# Patient Record
Sex: Male | Born: 1980
Health system: Southern US, Community
[De-identification: ages and names within clinical notes are randomized; demographics above are authoritative.]

## PROBLEM LIST (undated history)

## (undated) DIAGNOSIS — F900 Attention-deficit hyperactivity disorder, predominantly inattentive type: Secondary | ICD-10-CM

## (undated) HISTORY — DX: Attention-deficit hyperactivity disorder, predominantly inattentive type: F90.0

## (undated) HISTORY — PX: NASAL SEPTUM SURGERY: SHX37

## (undated) HISTORY — PX: EYE MUSCLE SURGERY: SHX370

## (undated) HISTORY — PX: WISDOM TOOTH EXTRACTION: SHX21

---

## 2000-02-02 ENCOUNTER — Emergency Department (HOSPITAL_COMMUNITY): Admission: EM | Admit: 2000-02-02 | Discharge: 2000-02-02 | Payer: Self-pay | Admitting: Emergency Medicine

## 2000-02-02 ENCOUNTER — Encounter: Payer: Self-pay | Admitting: Emergency Medicine

## 2008-02-04 ENCOUNTER — Encounter (INDEPENDENT_AMBULATORY_CARE_PROVIDER_SITE_OTHER): Payer: Self-pay | Admitting: *Deleted

## 2008-02-10 ENCOUNTER — Ambulatory Visit: Payer: Self-pay | Admitting: Family Medicine

## 2008-02-10 DIAGNOSIS — R030 Elevated blood-pressure reading, without diagnosis of hypertension: Secondary | ICD-10-CM | POA: Insufficient documentation

## 2008-02-12 ENCOUNTER — Telehealth (INDEPENDENT_AMBULATORY_CARE_PROVIDER_SITE_OTHER): Payer: Self-pay | Admitting: *Deleted

## 2008-02-12 LAB — CONVERTED CEMR LAB
ALT: 98 units/L — ABNORMAL HIGH (ref 0–53)
AST: 47 units/L — ABNORMAL HIGH (ref 0–37)
Albumin: 4.3 g/dL (ref 3.5–5.2)
Alkaline Phosphatase: 63 units/L (ref 39–117)
BUN: 11 mg/dL (ref 6–23)
Basophils Absolute: 0 10*3/uL (ref 0.0–0.1)
Basophils Relative: 0.4 % (ref 0.0–3.0)
Bilirubin, Direct: 0.2 mg/dL (ref 0.0–0.3)
CO2: 29 meq/L (ref 19–32)
Calcium: 9.7 mg/dL (ref 8.4–10.5)
Chloride: 102 meq/L (ref 96–112)
Cholesterol: 263 mg/dL (ref 0–200)
Creatinine, Ser: 1.2 mg/dL (ref 0.4–1.5)
Direct LDL: 171.2 mg/dL
Eosinophils Absolute: 0.1 10*3/uL (ref 0.0–0.7)
Eosinophils Relative: 3.1 % (ref 0.0–5.0)
GFR calc Af Amer: 93 mL/min
GFR calc non Af Amer: 77 mL/min
Glucose, Bld: 93 mg/dL (ref 70–99)
HCT: 42.2 % (ref 39.0–52.0)
HDL: 34.6 mg/dL — ABNORMAL LOW (ref >39.0)
Hemoglobin: 14.7 g/dL (ref 13.0–17.0)
Lymphocytes Relative: 37.2 % (ref 12.0–46.0)
MCHC: 34.9 g/dL (ref 30.0–36.0)
MCV: 91.4 fL (ref 78.0–100.0)
Monocytes Absolute: 0.3 10*3/uL (ref 0.1–1.0)
Monocytes Relative: 7.3 % (ref 3.0–12.0)
Neutro Abs: 2.5 10*3/uL (ref 1.4–7.7)
Neutrophils Relative %: 52 % (ref 43.0–77.0)
Platelets: 272 10*3/uL (ref 150–400)
Potassium: 4.5 meq/L (ref 3.5–5.1)
RBC: 4.61 M/uL (ref 4.22–5.81)
RDW: 12.5 % (ref 11.5–14.6)
Sodium: 139 meq/L (ref 135–145)
TSH: 1.95 microintl units/mL (ref 0.35–5.50)
Total Bilirubin: 1.4 mg/dL — ABNORMAL HIGH (ref 0.3–1.2)
Total CHOL/HDL Ratio: 7.6
Total Protein: 7.7 g/dL (ref 6.0–8.3)
Triglycerides: 232 mg/dL (ref 0–149)
VLDL: 46 mg/dL — ABNORMAL HIGH (ref 0–40)
WBC: 4.6 10*3/uL (ref 4.5–10.5)

## 2010-04-05 ENCOUNTER — Encounter: Payer: Self-pay | Admitting: Family Medicine

## 2010-04-05 ENCOUNTER — Ambulatory Visit
Admission: RE | Admit: 2010-04-05 | Discharge: 2010-04-05 | Payer: Self-pay | Source: Home / Self Care | Attending: Family Medicine | Admitting: Family Medicine

## 2010-04-05 DIAGNOSIS — L089 Local infection of the skin and subcutaneous tissue, unspecified: Secondary | ICD-10-CM | POA: Insufficient documentation

## 2010-04-05 DIAGNOSIS — L0291 Cutaneous abscess, unspecified: Secondary | ICD-10-CM | POA: Insufficient documentation

## 2010-04-05 DIAGNOSIS — L0390 Cellulitis, unspecified: Secondary | ICD-10-CM

## 2010-05-11 NOTE — Assessment & Plan Note (Signed)
Summary: cyst on back infected///sph   Vital Signs:  Patient profile:   30 year old male Weight:      256 pounds BMI:     37.94 Temp:     98.1 degrees F oral BP sitting:   120 / 80  (left arm)  Vitals Entered By: Doristine Devoid CMA (April 05, 2010 2:49 PM) CC: cyst on Lower back infected some redness and painful   History of Present Illness: 30 yo man here today for infected cyst on lower back.  first appeared  ~3 weeks ago.  went to UC 2.5 weeks ago and was started on Bactrim but is not taking it as directed as he still has pills left.  hx of similar previously but never required medical attention.  area is red and sore.  2 days ago squeezed the area and 'got a lot of stuff out of it'.  area is now much less painful.  Current Medications (verified): 1)  None  Allergies (verified): No Known Drug Allergies  Review of Systems      See HPI  Physical Exam  General:  Overweight, well-developed,well-nourished,in no acute distress; alert,appropriate and cooperative throughout examination Skin:  large area in center of lower back that is erythematous w/ central pore.  some induration.  thin, bloody drainage when pressed.   Impression & Recommendations:  Problem # 1:  INFECTION, SKIN AND SOFT TISSUE (ICD-686.9) Assessment New no need for I&D as area is draining spontaneously.  encouraged him to finish abx as directed.  reviewed supportive care and red flags that should prompt return.  Pt expresses understanding and is in agreement w/ this plan.  Other Orders: Specimen Handling (19147) T-Culture, Wound ([id number])  Patient Instructions: 1)  Finish the Bactrim as directed- two times a day 2)  Keep the area clean and dry 3)  Call if it again worsens- meaning the redness is again increasing or the pus is back 4)  Schedule your complete physical at your convenience 5)  Hang in there! 6)  Happy New Year!   Orders Added: 1)  Specimen Handling [99000] 2)  T-Culture,  Wound [[id number]] 3)  Est. Patient Level III [82956]

## 2012-06-06 ENCOUNTER — Other Ambulatory Visit: Payer: Self-pay | Admitting: Family Medicine

## 2012-06-06 MED ORDER — OSELTAMIVIR PHOSPHATE 75 MG PO CAPS: 75.0000 mg | ORAL_CAPSULE | Freq: Every day | ORAL | Status: DC

## 2012-10-31 ENCOUNTER — Encounter: Payer: Self-pay | Admitting: Physician Assistant

## 2012-10-31 ENCOUNTER — Ambulatory Visit (INDEPENDENT_AMBULATORY_CARE_PROVIDER_SITE_OTHER): Payer: Federal, State, Local not specified - PPO | Admitting: Physician Assistant

## 2012-10-31 VITALS — BP 124/82 | HR 97 | Temp 98.1°F | Resp 16 | Ht 69.0 in | Wt 246.2 lb

## 2012-10-31 DIAGNOSIS — K111 Hypertrophy of salivary gland: Secondary | ICD-10-CM

## 2012-10-31 DIAGNOSIS — R600 Localized edema: Secondary | ICD-10-CM

## 2012-10-31 DIAGNOSIS — Z0000 Encounter for general adult medical examination without abnormal findings: Secondary | ICD-10-CM

## 2012-10-31 NOTE — Patient Instructions (Signed)
Please come back next Monday morning to get fasting labs.  For swelling of saliva glands, massage under chin to help express saliva.  Also try sucking on sour candy to help with saliva production.  If pain worsens or persists past a week, please return to office.      Salivary Gland Infection A salivary gland infection can be caused by a virus, bacteria from the mouth, or a stone. Mumps and other viruses may settle in one or more of the saliva glands. This will result in swelling, pain, and difficulty eating. Bacteria may cause a more severe infection in a salivary gland. A salivary stone blocking the flow of saliva can make this worse. These infections may be related to other medical problems. Some of these are dehydration, recent surgery, poor nutrition, and some medications. TREATMENT  Treatment of a salivary gland infection depends on the cause. Mumps and other virus infections do not require antibiotics. If bacteria cause the infection, then antibiotics are needed to get rid of the infection. If there is a salivary stone blocking the duct, minor surgery to remove the stone may be needed.  HOME CARE INSTRUCTIONS   Get plenty of rest, increase your fluids, and use warm compresses on the swollen area for 15 to 20 minutes 4 times per day or as often as feels good to you.  Suck on hard candy or chew sugarless gum to promote saliva production.  Only take over-the-counter or prescription medicines for pain, discomfort, or fever as directed by your caregiver. SEEK IMMEDIATE MEDICAL CARE IF:   You have increased swelling or pain or pain not relieved with medications.  You develop chills or a fever.  Any of your problems are getting worse rather than better. Document Released: 05/03/2004 Document Revised: 06/18/2011 Document Reviewed: 03/26/2005 [facility] Patient Information 2014 [place], [place].

## 2012-10-31 NOTE — Assessment & Plan Note (Signed)
Patient to return Monday am to get fasting labs -- CBC, BMP, TSH, Lipid Profile

## 2012-10-31 NOTE — Assessment & Plan Note (Signed)
Does not look inflamed or infected at present time.  Patient advised how to milk the salivary duct to help reduce the swelling.  Also instructed to try sucking on sour candy to help relieve the swelling.  Swelling should subside in a couple of days.  If swelling worsens or pain becomes more severe, patient to return to clinic.

## 2012-10-31 NOTE — Progress Notes (Signed)
Patient ID: Douglas Winters, male   DOB: June 03, 1980, 32 y.o.   MRN: [id number]  Patient is a 32 year-old, caucasian male who presents to clinic today to establish care.   Acute Complaints: Patient complains of knot under the right side of his tongue for the past day.  States he has recently had a "sinus problem" within the past week that has cleared on its own.  States the pain first occurred last night after dinner and persisted for an hour or so.  He took some OTC pain medication with moderate relief of symptoms.  Since then he has not has any more pain but this am he noticed a knot under his tongue that has persisted.  Denies fever, headache, difficullty swallowing, sore throat.  Denies prior history of this type of issue.  Otherwise feels fine.    Past Medical History  Diagnosis Date  . Chicken pox   . Cellulitis of lower back     No current outpatient prescriptions on file prior to visit.   No current facility-administered medications on file prior to visit.   No Known Allergies  Family History  Problem Relation Age of Onset  . Healthy Mother   . Asthma Father     Childhood  . Diabetes Maternal Grandmother   . Clotting disorder Paternal Grandfather     Deceased  . Alcoholism Paternal Grandfather   . Healthy Brother   . Amblyopia Child     x2    History   Social History  . Marital Status: Married    Spouse Name: N/A    Number of Children: N/A  . Years of Education: N/A   Social History Main Topics  . Smoking status: Never Smoker   . Smokeless tobacco: Never Used  . Alcohol Use: 0.0 oz/week    0-1 Cans of beer per week  . Drug Use: No  . Sexually Active: Yes    Birth Control/ Protection: Condom   Other Topics Concern  . None   Social History Narrative  . None   Review of Systems  Constitutional: Negative for fever, chills, weight loss and malaise/fatigue.  HENT: Negative for hearing loss and neck pain.   Eyes: Negative for blurred vision, double vision and  photophobia.  Respiratory: Negative for cough, hemoptysis, sputum production, shortness of breath and wheezing.   Cardiovascular: Negative for chest pain, palpitations and leg swelling.  Gastrointestinal: Negative for heartburn, nausea, vomiting, abdominal pain, diarrhea, constipation, blood in stool and melena.  Genitourinary: Negative for dysuria, urgency, frequency and hematuria.  Musculoskeletal: Negative for myalgias, back pain and joint pain.  Neurological: Negative for dizziness, loss of consciousness and headaches.  Endo/Heme/Allergies: Negative for polydipsia. Does not bruise/bleed easily.  Psychiatric/Behavioral: Negative for depression, suicidal ideas and substance abuse.        Physical Exam  Constitutional: He is oriented to person, place, and time. He appears well-developed and well-nourished. No distress.  HENT:  Head: Normocephalic and atraumatic.  Right Ear: External ear normal.  Left Ear: External ear normal.  Nose: Nose normal.  Mouth/Throat: Oropharynx is clear and moist. No oropharyngeal exudate.  Area of swelling underneath mucosa adjacent to R Wharton duct.  Eyes: Conjunctivae and EOM are normal. Pupils are equal, round, and reactive to light.  Neck: Normal range of motion. Neck supple. No thyromegaly present.  Cardiovascular: Normal rate, regular rhythm, normal heart sounds and intact distal pulses.   Pulmonary/Chest: Effort normal and breath sounds normal. No respiratory distress. He has no wheezes.  He has no rales.  Abdominal: Soft. Bowel sounds are normal. He exhibits no distension. There is no tenderness. There is no rebound and no guarding.  Lymphadenopathy:    He has no cervical adenopathy.  Neurological: He is alert and oriented to person, place, and time. He has normal reflexes.  Skin: Skin is warm and dry. He is not diaphoretic.    Assessment/Plan: (1) R Submandibular Salivary Gland Swelling  -- Does not look inflamed or infected at present time.   Patient advised how to milk the salivary duct to help reduce the swelling.  Also instructed to try sucking on sour candy to help relieve the swelling.  Swelling should subside in a couple of days.  If swelling worsens or pain becomes more severe, patient to return to clinic.  (2) Preventive Health Visit -- Patient to return Monday am to get fasting labs -- CBC, BMP, TSH, Lipid Profile  Otherwise, patient to return to clinic as needed and yearly for CPE.

## 2012-11-03 LAB — CBC WITH DIFFERENTIAL/PLATELET
Basophils Absolute: 0 10*3/uL (ref 0.0–0.1)
Basophils Relative: 1 % (ref 0–1)
Eosinophils Absolute: 0.1 10*3/uL (ref 0.0–0.7)
Eosinophils Relative: 4 % (ref 0–5)
HCT: 39.5 % (ref 39.0–52.0)
Hemoglobin: 14.2 g/dL (ref 13.0–17.0)
Lymphocytes Relative: 44 % (ref 12–46)
Lymphs Abs: 1.6 10*3/uL (ref 0.7–4.0)
MCH: 31.1 pg (ref 26.0–34.0)
MCHC: 35.9 g/dL (ref 30.0–36.0)
MCV: 86.4 fL (ref 78.0–100.0)
Monocytes Absolute: 0.4 10*3/uL (ref 0.1–1.0)
Monocytes Relative: 11 % (ref 3–12)
Neutro Abs: 1.5 10*3/uL — ABNORMAL LOW (ref 1.7–7.7)
Neutrophils Relative %: 40 % — ABNORMAL LOW (ref 43–77)
Platelets: 278 10*3/uL (ref 150–400)
RBC: 4.57 MIL/uL (ref 4.22–5.81)
RDW: 13 % (ref 11.5–15.5)
WBC: 3.7 10*3/uL — ABNORMAL LOW (ref 4.0–10.5)

## 2012-11-03 LAB — LIPID PANEL
Cholesterol: 172 mg/dL (ref 0–200)
LDL Cholesterol: 108 mg/dL — ABNORMAL HIGH (ref 0–99)
VLDL: 35 mg/dL (ref 0–40)

## 2012-11-03 LAB — BASIC METABOLIC PANEL
BUN: 17 mg/dL (ref 6–23)
CO2: 27 mEq/L (ref 19–32)
Calcium: 9.7 mg/dL (ref 8.4–10.5)
Chloride: 103 mEq/L (ref 96–112)
Creat: 1.16 mg/dL (ref 0.50–1.35)
Glucose, Bld: 98 mg/dL (ref 70–99)
Potassium: 4.2 mEq/L (ref 3.5–5.3)
Sodium: 139 mEq/L (ref 135–145)

## 2012-11-03 NOTE — Addendum Note (Signed)
Addended by: Regis Bill on: 11/03/2012 09:57 AM   Modules accepted: Orders

## 2012-12-22 ENCOUNTER — Ambulatory Visit: Payer: Self-pay | Admitting: Family Medicine

## 2013-02-24 ENCOUNTER — Encounter: Payer: Self-pay | Admitting: Family

## 2013-02-24 ENCOUNTER — Ambulatory Visit (INDEPENDENT_AMBULATORY_CARE_PROVIDER_SITE_OTHER): Payer: Federal, State, Local not specified - PPO | Admitting: Family

## 2013-02-24 VITALS — BP 108/74 | HR 95 | Temp 98.2°F | Resp 18 | Ht 69.0 in | Wt 241.5 lb

## 2013-02-24 DIAGNOSIS — J329 Chronic sinusitis, unspecified: Secondary | ICD-10-CM

## 2013-02-24 MED ORDER — KETOROLAC TROMETHAMINE 30 MG/ML IJ SOLN
60.0000 mg | Freq: Once | INTRAMUSCULAR | Status: AC
  Administered 2013-02-24: 60 mg via INTRAMUSCULAR

## 2013-02-24 MED ORDER — AMOXICILLIN 500 MG PO CAPS: 500.0000 mg | ORAL_CAPSULE | Freq: Three times a day (TID) | ORAL | Status: DC

## 2013-02-24 MED ORDER — KETOROLAC TROMETHAMINE 60 MG/2ML IJ SOLN: 60.0000 mg | Freq: Once | INTRAMUSCULAR | Status: DC

## 2013-02-24 NOTE — Progress Notes (Signed)
Pre visit review using our clinic review tool, if applicable. No additional management support is needed unless otherwise documented below in the visit note/SLS  

## 2013-02-24 NOTE — Progress Notes (Signed)
  Subjective:    Patient ID: Douglas Winters, male    DOB: 1980-04-22, 32 y.o.   MRN: [id number]  HPI  Douglas Winters is a 32 yr old male who presents today with chief complaint of headache. Reports HA started Sunday evening.  Pain is worse with "pushing on eyeballs."  Reports mild post nasal drip. Reports temp last week 101.  Has tried excedrin and advil yesterday without improvement in symptoms. Denies associated sore throat, cough or severe neck pain.  Notes mild posterior neck soreness.  Denies hx of migraines.    Review of Systems See HPI  Past Medical History  Diagnosis Date  . Chicken pox   . Cellulitis of lower back     History   Social History  . Marital Status: Married    Spouse Name: N/A    Number of Children: N/A  . Years of Education: N/A   Occupational History  . Not on file.   Social History Main Topics  . Smoking status: Never Smoker   . Smokeless tobacco: Never Used  . Alcohol Use: 0.0 oz/week    0-1 Cans of beer per week  . Drug Use: No  . Sexual Activity: Yes    Birth Control/ Protection: Condom   Other Topics Concern  . Not on file   Social History Narrative  . No narrative on file    Past Surgical History  Procedure Laterality Date  . Wisdom tooth extraction    . Eye muscle surgery    . Nasal septum surgery      Family History  Problem Relation Age of Onset  . Healthy Mother   . Asthma Father     Childhood  . Diabetes Maternal Grandmother   . Clotting disorder Paternal Grandfather     Deceased  . Alcoholism Paternal Grandfather   . Healthy Brother   . Amblyopia Child     x2    No Known Allergies  Current Outpatient Prescriptions on File Prior to Visit  Medication Sig Dispense Refill  . Aspirin-Acetaminophen-Caffeine (EXCEDRIN EXTRA STRENGTH PO) Take by mouth as needed.       No current facility-administered medications on file prior to visit.    BP 108/74  Pulse 95  Temp(Src) 98.2 F (36.8 C) (Oral)  Resp 18  Ht 5\' 9"  (1.753  m)  Wt 241 lb 8 oz (109.544 kg)  BMI 35.65 kg/m2  SpO2 98%       Objective:   Physical Exam  Constitutional: He is oriented to person, place, and time. He appears well-developed and well-nourished.  HENT:  Head: Normocephalic and atraumatic.  L TM is mildy pink in color without bulging.  Tonsils are 2+  Cardiovascular: Normal rate and regular rhythm.   No murmur heard. Pulmonary/Chest: Effort normal and breath sounds normal. No respiratory distress. He has no wheezes. He has no rales. He exhibits no tenderness.  Musculoskeletal: He exhibits no edema.  Neurological: He is alert and oriented to person, place, and time.  Psychiatric: He has a normal mood and affect. His behavior is normal. Judgment and thought content normal.          Assessment & Plan:

## 2013-02-24 NOTE — Assessment & Plan Note (Addendum)
Rx with amoxicillin. Toradol 60mg  IM x 1 for HA.  Suspect early L OM as well. Follow up if symptoms worsen or if symptoms do not impove.

## 2013-02-24 NOTE — Patient Instructions (Signed)
Please call if symptoms worsen, or if not improved in 2-3 days.  

## 2013-06-17 ENCOUNTER — Telehealth: Payer: Self-pay | Admitting: Physician Assistant

## 2013-06-17 NOTE — Telephone Encounter (Signed)
Pt son was diagnosed with pink eye, now he has it, can something be called in or does he need to be seen. [facility] [address]

## 2013-06-17 NOTE — Telephone Encounter (Signed)
Called Pt and let him know he would need to be seen, very upset that he will need to come in, when he already knows what it is. States its just for us to get money. Will call another dr.

## 2013-06-17 NOTE — Telephone Encounter (Signed)
I typically will not call in an antibiotic without seeing the patient.  I would need him to come in for a visit. Sorry for the inconvenience.

## 2013-09-01 ENCOUNTER — Encounter: Payer: Self-pay | Admitting: Family

## 2013-09-01 ENCOUNTER — Ambulatory Visit (INDEPENDENT_AMBULATORY_CARE_PROVIDER_SITE_OTHER): Payer: Federal, State, Local not specified - PPO | Admitting: Family

## 2013-09-01 VITALS — BP 122/86 | HR 88 | Temp 97.9°F | Resp 18 | Ht 69.0 in | Wt 249.1 lb

## 2013-09-01 DIAGNOSIS — L02219 Cutaneous abscess of trunk, unspecified: Secondary | ICD-10-CM

## 2013-09-01 DIAGNOSIS — L03319 Cellulitis of trunk, unspecified: Principal | ICD-10-CM

## 2013-09-01 MED ORDER — DOXYCYCLINE HYCLATE 100 MG PO TABS: 100.0000 mg | ORAL_TABLET | Freq: Two times a day (BID) | ORAL | Status: DC

## 2013-09-01 NOTE — Progress Notes (Signed)
Pre visit review using our clinic review tool, if applicable. No additional management support is needed unless otherwise documented below in the visit note. 

## 2013-09-01 NOTE — Assessment & Plan Note (Signed)
No obvious fluctuance to drain today. Will start doxycycline. Advised pt to take as directed, monitor site for increased erythema, drainage.  Call if symptoms worsen, otherwise, follow up on Friday for re-evaluation.

## 2013-09-01 NOTE — Patient Instructions (Signed)
Please follow up on Friday

## 2013-09-01 NOTE — Progress Notes (Signed)
   Subjective:    Patient ID: Douglas Winters, male    DOB: [birth year], 33 y.o.   MRN: [id number]  HPI  Douglas Winters is a 33 yr old male who presents today to discuss a cyst on his back. Douglas Winters reports that symptoms started 5-6 days ago.  Has previous hx of cyst in the same area 3-4 years ago. Believes MD drained the cyst and gave him abx. Douglas Winters is scheduled to go to the beach on 6/8 and is concerned that this will interfere with his trip.  Area is painful. Denies associated drainage.   Review of Systems    see HPI  Past Medical History  Diagnosis Date  . Chicken pox   . Cellulitis of lower back     History   Social History  . Marital Status: Married    Spouse Name: N/A    Number of Children: N/A  . Years of Education: N/A   Occupational History  . Not on file.   Social History Main Topics  . Smoking status: Never Smoker   . Smokeless tobacco: Never Used  . Alcohol Use: 0.0 oz/week    0-1 Cans of beer per week  . Drug Use: No  . Sexual Activity: Yes    Birth Control/ Protection: Condom   Other Topics Concern  . Not on file   Social History Narrative  . No narrative on file    Past Surgical History  Procedure Laterality Date  . Wisdom tooth extraction    . Eye muscle surgery    . Nasal septum surgery      Family History  Problem Relation Age of Onset  . Healthy Mother   . Asthma Father     Childhood  . Diabetes Maternal Grandmother   . Clotting disorder Paternal Grandfather     Deceased  . Alcoholism Paternal Grandfather   . Healthy Brother   . Amblyopia Child     x2    No Known Allergies  No current outpatient prescriptions on file prior to visit.   No current facility-administered medications on file prior to visit.    BP 122/86  Pulse 88  Temp(Src) 97.9 F (36.6 C) (Oral)  Resp 18  Ht 5\' 9"  (1.753 m)  Wt 249 lb 1.9 oz (113 kg)  BMI 36.77 kg/m2  SpO2 99%    Objective:   Physical Exam  Constitutional: Douglas Winters is oriented to person, place, and  time. Douglas Winters appears well-developed and well-nourished. No distress.  Neurological: Douglas Winters is alert and oriented to person, place, and time.  Skin:     3 cm wide round area of tender induration. No obvious fluctuance  Psychiatric: Douglas Winters has a normal mood and affect. His behavior is normal. Judgment and thought content normal.          Assessment & Plan:

## 2013-09-04 ENCOUNTER — Ambulatory Visit: Payer: Federal, State, Local not specified - PPO | Admitting: Family

## 2013-09-04 ENCOUNTER — Ambulatory Visit (INDEPENDENT_AMBULATORY_CARE_PROVIDER_SITE_OTHER): Payer: Federal, State, Local not specified - PPO | Admitting: Family

## 2013-09-04 ENCOUNTER — Encounter: Payer: Self-pay | Admitting: Family

## 2013-09-04 VITALS — BP 124/82 | HR 100 | Temp 98.2°F | Resp 16 | Ht 69.0 in | Wt 246.1 lb

## 2013-09-04 DIAGNOSIS — L03319 Cellulitis of trunk, unspecified: Principal | ICD-10-CM

## 2013-09-04 DIAGNOSIS — L02219 Cutaneous abscess of trunk, unspecified: Secondary | ICD-10-CM

## 2013-09-04 NOTE — Patient Instructions (Signed)
Keep area dry for 24 hours.  Continue doxycycline. Follow up on Tuesday of next week. Call if you develop fever, swelling, worsening pain.

## 2013-09-04 NOTE — Progress Notes (Signed)
Pre visit review using our clinic review tool, if applicable. No additional management support is needed unless otherwise documented below in the visit note. 

## 2013-09-04 NOTE — Progress Notes (Signed)
   Subjective:    Patient ID: PRAGYAN CRITELLI, male    DOB: 1980-05-05, 33 y.o.   MRN: [id number]  HPI  Mr. Pasierb is a 33 yr old male who presents today for follow up of the abscess on his lower back. He was started on doxycycline on 09/01/13.  He reports no improvement in the abscess.     Review of Systems     Objective:   Physical Exam  Constitutional: He appears well-developed and well-nourished. No distress.  Skin:  Abscess diameter 4cm, some fluctuance noted.  Psychiatric: He has a normal mood and affect. His behavior is normal. Judgment and thought content normal.          Assessment & Plan:

## 2013-09-04 NOTE — Assessment & Plan Note (Signed)
Slightly worsened and now fluctuant.   Procedure including risks/benefits explained to patient.  Questions were answered. After informed consent was obtained and a time out completed, site was cleansed with betadine and then alcohol. 1% Lidocaine with epinephrine was infiltrated into abscess and incision and drainage was performed. Copious pruritic drainage was expressed from the abscess.  Abscess appeared loculated with multiple pockets which were broken up gently with sterile q tip. Pt tolerated procedure well.  Pt instructed to keep the area dry for 24 hours and to contact us if he develops redness, drainage or swelling at the site.  Continue doxycycline. Pt may use tylenol as needed for discomfort today.

## 2013-09-07 ENCOUNTER — Ambulatory Visit (INDEPENDENT_AMBULATORY_CARE_PROVIDER_SITE_OTHER): Payer: Federal, State, Local not specified - PPO | Admitting: Family

## 2013-09-07 ENCOUNTER — Ambulatory Visit: Payer: Federal, State, Local not specified - PPO | Admitting: Family

## 2013-09-07 VITALS — BP 114/88 | HR 75 | Temp 98.1°F | Resp 16 | Ht 69.0 in | Wt 249.0 lb

## 2013-09-07 DIAGNOSIS — L02219 Cutaneous abscess of trunk, unspecified: Secondary | ICD-10-CM

## 2013-09-07 DIAGNOSIS — L03319 Cellulitis of trunk, unspecified: Principal | ICD-10-CM

## 2013-09-07 LAB — WOUND CULTURE
GRAM STAIN: NONE SEEN
ORGANISM ID, BACTERIA: NO GROWTH

## 2013-09-07 NOTE — Assessment & Plan Note (Signed)
Resolving, complete abx, follow up if symptoms do not continue to improve.

## 2013-09-07 NOTE — Progress Notes (Signed)
   Subjective:    Patient ID: Douglas Winters, male    DOB: 1980-07-20, 33 y.o.   MRN: [id number]  HPI  Douglas Winters is a 33 yr old male who presents today for follow up of the abscess on his lower back.  He continues doxycycline and notes that he had some purulent drainage over the weekend in the shower which has now turned clear.  Notes resolution of pain.     Review of Systems    see HPI  Past Medical History  Diagnosis Date  . Chicken pox   . Cellulitis of lower back     History   Social History  . Marital Status: Married    Spouse Name: N/A    Number of Children: N/A  . Years of Education: N/A   Occupational History  . Not on file.   Social History Main Topics  . Smoking status: Never Smoker   . Smokeless tobacco: Never Used  . Alcohol Use: 0.0 oz/week    0-1 Cans of beer per week  . Drug Use: No  . Sexual Activity: Yes    Birth Control/ Protection: Condom   Other Topics Concern  . Not on file   Social History Narrative  . No narrative on file    Past Surgical History  Procedure Laterality Date  . Wisdom tooth extraction    . Eye muscle surgery    . Nasal septum surgery      Family History  Problem Relation Age of Onset  . Healthy Mother   . Asthma Father     Childhood  . Diabetes Maternal Grandmother   . Clotting disorder Paternal Grandfather     Deceased  . Alcoholism Paternal Grandfather   . Healthy Brother   . Amblyopia Child     x2    No Known Allergies  Current Outpatient Prescriptions on File Prior to Visit  Medication Sig Dispense Refill  . doxycycline (VIBRA-TABS) 100 MG tablet Take 1 tablet (100 mg total) by mouth 2 (two) times daily.  20 tablet  0   No current facility-administered medications on file prior to visit.    BP 114/88  Pulse 75  Temp(Src) 98.1 F (36.7 C) (Oral)  Resp 16  Ht 5\' 9"  (1.753 m)  Wt 249 lb (112.946 kg)  BMI 36.75 kg/m2  SpO2 97%    Objective:   Physical Exam  Constitutional: He appears  well-developed and well-nourished. No distress.  Skin:  Abscess is now about 2 cm in diameter, with very mild induration, no fluctuance, some clear drainage from well approximated incision.  Psychiatric: He has a normal mood and affect. His behavior is normal. Judgment and thought content normal.          Assessment & Plan:

## 2013-09-07 NOTE — Patient Instructions (Signed)
Continue antibiotics until complete. Follow up if symptoms do not continue to improve.

## 2013-09-07 NOTE — Progress Notes (Signed)
Pre visit review using our clinic review tool, if applicable. No additional management support is needed unless otherwise documented below in the visit note. 

## 2014-04-01 ENCOUNTER — Encounter: Payer: Self-pay | Admitting: Family Medicine

## 2014-04-01 ENCOUNTER — Ambulatory Visit (INDEPENDENT_AMBULATORY_CARE_PROVIDER_SITE_OTHER): Payer: Federal, State, Local not specified - PPO | Admitting: Family Medicine

## 2014-04-01 VITALS — BP 110/64 | HR 88 | Temp 98.5°F | Ht 69.0 in | Wt 247.9 lb

## 2014-04-01 DIAGNOSIS — M25572 Pain in left ankle and joints of left foot: Secondary | ICD-10-CM

## 2014-04-01 DIAGNOSIS — R030 Elevated blood-pressure reading, without diagnosis of hypertension: Secondary | ICD-10-CM

## 2014-04-01 NOTE — Progress Notes (Signed)
Pre visit review using our clinic review tool, if applicable. No additional management support is needed unless otherwise documented below in the visit note. 

## 2014-04-01 NOTE — Patient Instructions (Signed)
Minimize sodium, elevate and ice twice a day and apply Salon Pas gel 2-3 x a day. Call if worsens. Dr Jabier MuttonScholl gel insert    Electroneurography Test This test measures the conduction of the nerves throughout the body. It is very similar to the way electricity travels throughout your house. When a stimulus is activated at one site, this test determines how long it takes to travel to another location such as making a muscle contract. It would be similar to turning on a light switch and measuring the time it takes for the light bulb to come on. This test is called electroneurography and studies the nerves. PREPARATION FOR TEST No preparation or fasting is necessary. NORMAL FINDINGS No evidence of peripheral nerve injury or disease (conduction velocity is usually lower in the elderly). Ranges for normal findings may vary among different laboratories and hospitals. You should always check with your doctor after having lab work or other tests done to discuss the meaning of your test results and whether your values are considered within normal limits. MEANING OF TEST  Your caregiver will go over the test results with you and discuss the importance and meaning of your results, as well as treatment options and the need for additional tests if necessary. OBTAINING THE TEST RESULTS It is your responsibility to obtain your test results. Ask the lab or department performing the test when and how you will get your results. Document Released: 07/27/2004 Document Revised: 08/10/2013 Document Reviewed: 03/05/2008 [facility] Patient Information 2015 [place], [place]. This information is not intended to replace advice given to you by your health care provider. Make sure you discuss any questions you have with your health care provider.

## 2014-04-06 ENCOUNTER — Encounter: Payer: Self-pay | Admitting: Family Medicine

## 2014-04-06 DIAGNOSIS — M25579 Pain in unspecified ankle and joints of unspecified foot: Secondary | ICD-10-CM | POA: Insufficient documentation

## 2014-04-06 NOTE — Assessment & Plan Note (Addendum)
Denies CP/palp/SOB/HA/congestion/fevers/GI or GU c/o. 

## 2014-04-06 NOTE — Assessment & Plan Note (Signed)
Describes numbness between first and second toes on left foot off anc on for 2 months, more steady for past 2 weeks. No acute injury but did have a jolt doing his Jujitsu about 2 months ago. Encouraged ice and Salon Pas gel bid and if no improvement or any worsening let us know

## 2014-04-06 NOTE — Progress Notes (Signed)
Douglas Winters  [id number] 08/19/1980 04/06/2014      Progress Note-Follow Up  Subjective  Chief Complaint  Chief Complaint  Patient presents with  . Foot Pain    (L) along numbness between toes-noticed 1 mos ago    HPI  Patient is a 33 y.o. male in today for routine medical care. Describes numbness between first and second toes on left foot off anc on for 2 months, more steady for past 2 weeks. No acute injury but did have a jolt doing his Jujitsu about 2 months ago. Denies CP/palp/SOB/HA/congestion/fevers/GI or GU c/o. Taking meds as prescribed  Past Medical History  Diagnosis Date  . Chicken pox   . Cellulitis of lower back   . Pain in joint, ankle and foot 04/06/2014    Past Surgical History  Procedure Laterality Date  . Wisdom tooth extraction    . Eye muscle surgery    . Nasal septum surgery      Family History  Problem Relation Age of Onset  . Healthy Mother   . Asthma Father     Childhood  . Diabetes Maternal Grandmother   . Clotting disorder Paternal Grandfather     Deceased  . Alcoholism Paternal Grandfather   . Healthy Brother   . Amblyopia Child     x2    History   Social History  . Marital Status: Married    Spouse Name: N/A    Number of Children: N/A  . Years of Education: N/A   Occupational History  . Not on file.   Social History Main Topics  . Smoking status: Never Smoker   . Smokeless tobacco: Never Used  . Alcohol Use: 0.0 oz/week    0-1 Cans of beer per week  . Drug Use: No  . Sexual Activity: Yes    Birth Control/ Protection: Condom   Other Topics Concern  . Not on file   Social History Narrative    No current outpatient prescriptions on file prior to visit.   No current facility-administered medications on file prior to visit.    No Known Allergies  Review of Systems  Review of Systems  Constitutional: Negative for fever and malaise/fatigue.  HENT: Negative for congestion.   Eyes: Negative for discharge.    Respiratory: Negative for shortness of breath.   Cardiovascular: Negative for chest pain, palpitations and leg swelling.  Gastrointestinal: Negative for nausea, abdominal pain and diarrhea.  Genitourinary: Negative for dysuria.  Musculoskeletal: Positive for joint pain. Negative for falls.       Left foot discomfort  Skin: Negative for rash.  Neurological: Positive for sensory change. Negative for loss of consciousness and headaches.       Left foot numb between 1 and 2 toes  Psychiatric/Behavioral: Negative for depression and suicidal ideas. The patient is not nervous/anxious and does not have insomnia.     Objective  BP 110/64 mmHg  Pulse 88  Temp(Src) 98.5 F (36.9 C) (Oral)  Ht 5\' 9"  (1.753 m)  Wt 247 lb 14.4 oz (112.447 kg)  BMI 36.59 kg/m2  SpO2 99%  Physical Exam  Physical Exam  Lab Results  Component Value Date   TSH 2.717 11/03/2012   Lab Results  Component Value Date   WBC 3.7* 11/03/2012   HGB 14.2 11/03/2012   HCT 39.5 11/03/2012   MCV 86.4 11/03/2012   PLT 278 11/03/2012   Lab Results  Component Value Date   CREATININE 1.16 11/03/2012   BUN 17 11/03/2012  NA 139 11/03/2012   K 4.2 11/03/2012   CL 103 11/03/2012   CO2 27 11/03/2012   Lab Results  Component Value Date   ALT 98* 02/10/2008   AST 47* 02/10/2008   ALKPHOS 63 02/10/2008   BILITOT 1.4* 02/10/2008   Lab Results  Component Value Date   CHOL 172 11/03/2012   Lab Results  Component Value Date   HDL 29* 11/03/2012   Lab Results  Component Value Date   LDLCALC 108* 11/03/2012   Lab Results  Component Value Date   TRIG 176* 11/03/2012   Lab Results  Component Value Date   CHOLHDL 5.9 11/03/2012     Assessment & Plan  ELEVATED BP READING WITHOUT DX HYPERTENSION Denies CP/palp/SOB/HA/congestion/fevers/GI or GU c/o.   Pain in joint, ankle and foot Describes numbness between first and second toes on left foot off anc on for 2 months, more steady for past 2 weeks. No  acute injury but did have a jolt doing his Jujitsu about 2 months ago. Encouraged ice and Salon Pas gel bid and if no improvement or any worsening let us know

## 2015-11-25 ENCOUNTER — Other Ambulatory Visit (HOSPITAL_BASED_OUTPATIENT_CLINIC_OR_DEPARTMENT_OTHER): Payer: Self-pay | Admitting: Orthopaedic Surgery

## 2015-11-25 DIAGNOSIS — M549 Dorsalgia, unspecified: Secondary | ICD-10-CM

## 2015-11-28 ENCOUNTER — Ambulatory Visit (HOSPITAL_BASED_OUTPATIENT_CLINIC_OR_DEPARTMENT_OTHER)
Admission: RE | Admit: 2015-11-28 | Discharge: 2015-11-28 | Disposition: A | Discharge status: Home / Self Care | Payer: Federal, State, Local not specified - PPO | Source: Ambulatory Visit | Attending: Orthopaedic Surgery | Admitting: Orthopaedic Surgery

## 2015-11-28 DIAGNOSIS — M549 Dorsalgia, unspecified: Secondary | ICD-10-CM

## 2015-11-28 DIAGNOSIS — M5136 Other intervertebral disc degeneration, lumbar region: Secondary | ICD-10-CM | POA: Insufficient documentation

## 2016-06-11 ENCOUNTER — Ambulatory Visit (INDEPENDENT_AMBULATORY_CARE_PROVIDER_SITE_OTHER): Payer: Federal, State, Local not specified - PPO | Admitting: Orthopaedic Surgery

## 2016-06-11 ENCOUNTER — Encounter (INDEPENDENT_AMBULATORY_CARE_PROVIDER_SITE_OTHER): Payer: Self-pay

## 2016-06-11 DIAGNOSIS — M5441 Lumbago with sciatica, right side: Secondary | ICD-10-CM

## 2016-06-11 DIAGNOSIS — G8929 Other chronic pain: Secondary | ICD-10-CM

## 2016-06-11 NOTE — Progress Notes (Signed)
The patient's well-known to me. We have seen him before for low back pain and occasional sciatic components. This been going on for 4 to 5 years now and when he has episodes been taking longer and longer to improve. When he does episodes he knows how to do back extension exercises and these have helped. He denies any change in bowel or bladder function but does report radicular symptoms going down his right side and his backside all the way to his knee and into his foot. He is not a diabetic. He is tried anti-inflammatories. Again is tried a back extension exercise program. He has had therapy in the past. He denies any recent illnesses. He denies any significant weakness in his legs.  He is alert and oriented 3 and in no acute distress. Of note he denies any chest pain, shortness of breath, fever, chills, nausea, vomiting, headache. On examination he has pain with flexion-extension lumbar spine. He has a positive straight leg raise to both of the left and right sides. He has sciatic is the right side. He has slight weakness in his foot dorsiflexion and some slight numbness in the L5 distribution. We obtained x-rays last August of his lumbar spine and that showed just some minimal disc space narrowing.  Given his clinical exam and significant radicular symptoms combined with the failure of conservative treatment and MRI is warranted to rule out herniated disc. We'll see him back once this MRI is obtained.

## 2016-06-12 ENCOUNTER — Other Ambulatory Visit (INDEPENDENT_AMBULATORY_CARE_PROVIDER_SITE_OTHER): Payer: Self-pay

## 2016-06-12 DIAGNOSIS — G8929 Other chronic pain: Secondary | ICD-10-CM

## 2016-06-12 DIAGNOSIS — M5441 Lumbago with sciatica, right side: Principal | ICD-10-CM

## 2016-06-12 DIAGNOSIS — M5442 Lumbago with sciatica, left side: Principal | ICD-10-CM

## 2016-06-23 ENCOUNTER — Ambulatory Visit
Admission: RE | Admit: 2016-06-23 | Discharge: 2016-06-23 | Disposition: A | Discharge status: Home / Self Care | Payer: Federal, State, Local not specified - PPO | Source: Ambulatory Visit | Attending: Orthopaedic Surgery | Admitting: Orthopaedic Surgery

## 2016-06-23 DIAGNOSIS — M5441 Lumbago with sciatica, right side: Principal | ICD-10-CM

## 2016-06-23 DIAGNOSIS — M47816 Spondylosis without myelopathy or radiculopathy, lumbar region: Secondary | ICD-10-CM | POA: Diagnosis not present

## 2016-06-23 DIAGNOSIS — G8929 Other chronic pain: Secondary | ICD-10-CM

## 2016-06-23 DIAGNOSIS — M5442 Lumbago with sciatica, left side: Principal | ICD-10-CM

## 2016-06-25 ENCOUNTER — Ambulatory Visit (INDEPENDENT_AMBULATORY_CARE_PROVIDER_SITE_OTHER): Payer: Federal, State, Local not specified - PPO | Admitting: Orthopaedic Surgery

## 2016-06-28 ENCOUNTER — Ambulatory Visit (INDEPENDENT_AMBULATORY_CARE_PROVIDER_SITE_OTHER): Payer: Federal, State, Local not specified - PPO | Admitting: Physician Assistant

## 2016-07-02 ENCOUNTER — Ambulatory Visit (INDEPENDENT_AMBULATORY_CARE_PROVIDER_SITE_OTHER): Payer: Federal, State, Local not specified - PPO | Admitting: Orthopaedic Surgery

## 2016-07-02 DIAGNOSIS — G8929 Other chronic pain: Secondary | ICD-10-CM | POA: Diagnosis not present

## 2016-07-02 DIAGNOSIS — M5441 Lumbago with sciatica, right side: Secondary | ICD-10-CM | POA: Diagnosis not present

## 2016-07-02 NOTE — Progress Notes (Signed)
The is someone we've seen over the years. With only obtain an MRI of his lumbar spine due to some chronic back issues that flare up on occasion. He gets quite significant pain with little to no call of this. This is going on for years now we finally sent him for an MRI to look for any type of nerve compression. He says today is having a better day and is got back to the gym. He is not having any significant radicular symptoms except for some pain going down his right backside.  He is healthy-appearing his get good flexion extension of his lumbar spine but some pain in the lower aspect lumbar spine. He does have positive straight leg raise on the right side. The MRI shows a disc herniation at L4-L5 that is eccentric to the right. This could be irritating the nerve roots in this area. He also has facet joint arthritis at this area.  At this point he wants to stay as conservative as possible and I agree with this. We will send him to physical therapy. I think that she consider traction on his back as well as McKenzie exercises no modalities to help with his back. He may benefit from an epidural steroid injection at some point. We will try to stay as conservative as first.  He should ask the therapist about an inversion table as well. We'll see him back in about 6 weeks to see how he is doing overall.

## 2016-07-10 DIAGNOSIS — H05232 Hemorrhage of left orbit: Secondary | ICD-10-CM | POA: Diagnosis not present

## 2016-07-10 DIAGNOSIS — S0921XA Traumatic rupture of right ear drum, initial encounter: Secondary | ICD-10-CM | POA: Diagnosis not present

## 2016-09-17 DIAGNOSIS — H18821 Corneal disorder due to contact lens, right eye: Secondary | ICD-10-CM | POA: Diagnosis not present

## 2016-11-22 DIAGNOSIS — K080 Exfoliation of teeth due to systemic causes: Secondary | ICD-10-CM | POA: Diagnosis not present

## 2017-06-24 DIAGNOSIS — K080 Exfoliation of teeth due to systemic causes: Secondary | ICD-10-CM | POA: Diagnosis not present

## 2017-09-04 ENCOUNTER — Ambulatory Visit: Payer: Federal, State, Local not specified - PPO | Admitting: Family Medicine

## 2017-09-04 ENCOUNTER — Telehealth: Payer: Self-pay | Admitting: Family Medicine

## 2017-09-04 ENCOUNTER — Encounter: Payer: Self-pay | Admitting: Family Medicine

## 2017-09-04 VITALS — BP 108/68 | HR 66 | Temp 98.3°F | Ht 69.0 in | Wt 233.0 lb

## 2017-09-04 DIAGNOSIS — Z3009 Encounter for other general counseling and advice on contraception: Secondary | ICD-10-CM

## 2017-09-04 DIAGNOSIS — R41840 Attention and concentration deficit: Secondary | ICD-10-CM

## 2017-09-04 MED ORDER — DEXTROAMPHETAMINE SULFATE ER 10 MG PO CP24: 10.0000 mg | ORAL_CAPSULE | Freq: Every day | ORAL | 0 refills | Status: DC

## 2017-09-04 NOTE — Progress Notes (Signed)
Chief Complaint  Patient presents with  . New Patient (Initial Visit)       New Patient Visit SUBJECTIVE: HPI: Douglas Winters is an 37 y.o.male who is being seen for establishing care.  The patient had been seen in our office, however had not been seen in over 3 years.  He is a father of 2 and no longer wishes to have any children.  He is interested in having a vasectomy.  The patient has a history of inattention.  It is been most pronounced over the past 5 years since starting his job at the [employer].  He admits it is mundane work and believes it could be related that, however he does have issues with attention while there.  He does not have vivid memories of having this issue as a child, however notes he was smart enough to get good grades from grade school through college.  His son was formally diagnosed with ADHD and does very well on Concerta.  Patient states he sleeps well.  Denies any anxiety or depression.  No Known Allergies  Past Medical History:  Diagnosis Date  . Cellulitis of lower back   . Chicken pox   . Pain in joint, ankle and foot 04/06/2014   Past Surgical History:  Procedure Laterality Date  . EYE MUSCLE SURGERY    . NASAL SEPTUM SURGERY    . WISDOM TOOTH EXTRACTION     Family History  Problem Relation Age of Onset  . Healthy Mother   . Asthma Father        Childhood  . Diabetes Maternal Grandmother   . Clotting disorder Paternal Grandfather        Deceased  . Alcoholism Paternal Grandfather   . Healthy Brother   . Amblyopia Child        x2   No Known Allergies  Takes no medications routinely.  ROS Psych: As noted in HPI  Cardiac: Denies palpitations   OBJECTIVE: BP 108/68 (BP Location: Left Arm, Patient Position: Sitting, Cuff Size: Large)   Pulse 66   Temp 98.3 F (36.8 C) (Oral)   Ht  (1.753 m)   Wt 233 lb (105.7 kg)   SpO2 98%   BMI 34.41 kg/m   Constitutional: -  VS reviewed -  Well developed, well nourished, appears  stated age -  No apparent distress  Psychiatric: -  Oriented to person, place, and time -  Memory intact -  Affect and mood normal -  Fluent conversation, good eye contact -  Judgment and insight age appropriate  Eye: -  Conjunctivae clear, no discharge -  Pupils symmetric, round, reactive to light  ENMT: -  MMM    Pharynx moist, no exudate, no erythema  Neck: -  No gross swelling, no palpable masses -  Thyroid midline, not enlarged, mobile, no palpable masses  Cardiovascular: -  RRR -  No LE edema  Respiratory: -  Normal respiratory effort, no accessory muscle use, no retraction -  Breath sounds equal, no wheezes, no ronchi, no crackles  Gastrointestinal: -  Bowel sounds normal -  No tenderness, no distention, no guarding, no masses  Neurological:  -  DTR's equal and symmetric  Skin: -  No significant lesion on inspection -  Warm and dry to palpation   ASSESSMENT/PLAN: Inattention - Plan: Ambulatory referral to Psychology, dextroamphetamine (DEXEDRINE SPANSULE) 10 MG 24 hr capsule  Vasectomy evaluation - Plan: Ambulatory referral to Urology  Refer for formal eval.  Trial stimulant.  I informed patient that if he does not have ADHD diagnosis formal evaluation, we will stop the stimulant.  He agrees with this. Patient should return in 6 mo for cpe. The patient voiced understanding and agreement to the plan.   Jilda Roche [place], DO 09/04/17  12:09 PM

## 2017-09-04 NOTE — Progress Notes (Signed)
Pre visit review using our clinic review tool, if applicable. No additional management support is needed unless otherwise documented below in the visit note. 

## 2017-09-04 NOTE — Patient Instructions (Addendum)
If you do not hear anything about your referrals in the next 1-2 weeks, call our office and ask for an update.  Let me know if medicine is too expensive.   Let us know if you need anything.

## 2017-09-04 NOTE — Telephone Encounter (Signed)
Copied from CRM [id number]. Topic: Quick Communication - Rx Refill/Question >> Sep 04, 2017  5:26 PM Alexander Bergeron B wrote: Medication: dextroamphetamine (DEXEDRINE SPANSULE) 10 MG 24 hr capsule [[id number]]   Pt called and was told by pharmacist that a PA was needed to fill medication, pt just wants to be filled in on the status of it, call or email back

## 2017-09-05 ENCOUNTER — Encounter: Payer: Self-pay | Admitting: Family Medicine

## 2017-09-05 NOTE — Telephone Encounter (Signed)
PA approved. Effective 08/06/2017 through 09/05/2018.

## 2017-09-05 NOTE — Telephone Encounter (Signed)
PA initiated via Covermymeds; KEY: [id number]. Awaiting determination.

## 2017-09-12 ENCOUNTER — Encounter: Payer: Self-pay | Admitting: Family Medicine

## 2017-09-12 NOTE — Telephone Encounter (Signed)
Let's schedule a 1 mo follow up so we can see if we can to stay the course or see if there is another option we should pursue. TY.

## 2017-09-25 ENCOUNTER — Encounter: Payer: Self-pay | Admitting: Family Medicine

## 2017-09-25 ENCOUNTER — Ambulatory Visit: Payer: Federal, State, Local not specified - PPO | Admitting: Family Medicine

## 2017-09-25 VITALS — BP 116/64 | HR 68 | Temp 98.3°F | Ht 69.0 in | Wt 219.2 lb

## 2017-09-25 DIAGNOSIS — R41840 Attention and concentration deficit: Secondary | ICD-10-CM | POA: Diagnosis not present

## 2017-09-25 MED ORDER — DEXTROAMPHETAMINE SULFATE ER 15 MG PO CP24: 15.0000 mg | ORAL_CAPSULE | Freq: Every day | ORAL | 0 refills | Status: DC

## 2017-09-25 NOTE — Progress Notes (Signed)
Pre visit review using our clinic review tool, if applicable. No additional management support is needed unless otherwise documented below in the visit note. 

## 2017-09-25 NOTE — Patient Instructions (Addendum)
Take the new dose and let us know if things are working in the next 3-4 weeks when you need a refill. Don't take 2 caps of anything anymore!  Keep up the good work with your weight loss.  Let us know if you need anything.

## 2017-09-25 NOTE — Progress Notes (Signed)
Chief Complaint  Patient presents with  . Follow-up    Douglas PangRobert M Winters is 37 y.o. male here for Inattention follow up.  Patient is currently on Dexedrin 10 mg/d and compliance is excellent. Symptoms include none. Side effects include none. Patient believes their dose should be increased. Started to taper off. Denies tics, weight loss, difficulties with sleep, self-medication, alcohol/drug abuse, chest pain, or palpitations. Appt in Aug with behavioral health for adhd eval.   ROS:  Heart- denies chest pain or palpitations Psych- as noted in HPI  Past Medical History:  Diagnosis Date  . No known health problems    BP 116/64 (BP Location: Left Arm, Patient Position: Sitting, Cuff Size: Large)   Pulse 68   Temp 98.3 F (36.8 C) (Oral)   Ht 5\' 9"  (1.753 m)   Wt 219 lb 4 oz (99.5 kg)   SpO2 97%   BMI 32.38 kg/m  Gen- awake, alert, appearing stated age HEENT- PERRLA, MMM Heart- RRR, no murmurs, no bruits Lungs- CTAB, no accessory muscle use Neuro- no facial tics Psych- age appropriate judgment and insight, normal mood and affect  Inattention - Plan: dextroamphetamine (DEXEDRINE SPANSULE) 15 MG 24 hr capsule  Orders as above. F/u in 3 mo for med recheck. Pt voiced understanding and agreement to the plan.  Jilda Rocheicholas [place], DO 09/25/17 10:01 AM

## 2017-10-01 ENCOUNTER — Telehealth: Payer: Self-pay

## 2017-10-01 NOTE — Telephone Encounter (Signed)
PA initiated via Covermymeds; KEY: [id number]. Awaiting determination.

## 2017-10-01 NOTE — Telephone Encounter (Signed)
PA approved. Effective 09/01/2017 to 08/31/2018.

## 2017-10-24 DIAGNOSIS — N4341 Spermatocele of epididymis, single: Secondary | ICD-10-CM | POA: Diagnosis not present

## 2017-10-24 DIAGNOSIS — Z3009 Encounter for other general counseling and advice on contraception: Secondary | ICD-10-CM | POA: Diagnosis not present

## 2017-10-25 ENCOUNTER — Encounter: Payer: Self-pay | Admitting: Family Medicine

## 2017-10-25 ENCOUNTER — Ambulatory Visit (INDEPENDENT_AMBULATORY_CARE_PROVIDER_SITE_OTHER): Payer: Federal, State, Local not specified - PPO | Admitting: Family Medicine

## 2017-10-25 VITALS — BP 112/80 | HR 76 | Temp 97.6°F | Ht 69.0 in | Wt 215.2 lb

## 2017-10-25 DIAGNOSIS — Z114 Encounter for screening for human immunodeficiency virus [HIV]: Secondary | ICD-10-CM | POA: Diagnosis not present

## 2017-10-25 DIAGNOSIS — Z0000 Encounter for general adult medical examination without abnormal findings: Secondary | ICD-10-CM

## 2017-10-25 NOTE — Patient Instructions (Addendum)
Keep the diet clean and stay active.  1-2 days to get the results of your labs back.   Do monthly self testicular checks in the shower. You are feeling for lumps/bumps that don't belong. If you feel anything like this, let me know!  Let us know if you need anything.

## 2017-10-25 NOTE — Progress Notes (Signed)
Pre visit review using our clinic review tool, if applicable. No additional management support is needed unless otherwise documented below in the visit note. 

## 2017-10-25 NOTE — Progress Notes (Signed)
Chief Complaint  Patient presents with  . Annual Exam    Well Male Douglas Winters is here for a complete physical.   His last physical was >1 year ago.  Current diet: in general, a "healthy" diet.   Current exercise: Sambo, jiu-jitsus, strength training Weight trend: Losing intentionally Does pt snore? No. Daytime fatigue? No. Seat belt? Yes.    Health maintenance Tetanus- yes, 08/08/11 HIV- No  Past Medical History:  Diagnosis Date  . No known health problems      Past Surgical History:  Procedure Laterality Date  . EYE MUSCLE SURGERY    . NASAL SEPTUM SURGERY    . WISDOM TOOTH EXTRACTION      Medications  Current Outpatient Medications on File Prior to Visit  Medication Sig Dispense Refill  . dextroamphetamine (DEXEDRINE SPANSULE) 15 MG 24 hr capsule Take 1 capsule (15 mg total) by mouth daily. 30 capsule 0   Allergies No Known Allergies  Family History Family History  Problem Relation Age of Onset  . Healthy Mother   . Asthma Father        Childhood  . Diabetes Maternal Grandmother   . Clotting disorder Paternal Grandfather        Deceased  . Alcoholism Paternal Grandfather   . Healthy Brother   . Amblyopia Child        x2    Review of Systems: Constitutional: no fevers or chills Eye:  no recent significant change in vision Ear/Nose/Mouth/Throat:  Ears:  no tinnitus or hearing loss Nose/Mouth/Throat:  no complaints of nasal congestion, no sore throat Cardiovascular:  no chest pain, no palpitations Respiratory:  no cough and no shortness of breath Gastrointestinal:  no abdominal pain, no change in bowel habits GU:  Male: negative for dysuria, frequency, and incontinence and negative for prostate symptoms Musculoskeletal/Extremities:  no pain, redness, or swelling of the joints Integumentary (Skin/Breast):  no abnormal skin lesions reported Neurologic:  no headaches, no numbness, tingling Endocrine: No unexpected weight changes Hematologic/Lymphatic:   no night sweats  Exam BP 112/80 (BP Location: Left Arm, Patient Position: Sitting, Cuff Size: Large)   Pulse 76   Temp 97.6 F (36.4 C) (Oral)   Ht 5\' 9"  (1.753 m)   Wt 215 lb 4 oz (97.6 kg)   SpO2 98%   BMI 31.79 kg/m  General:  well developed, well nourished, in no apparent distress Skin:  no significant moles, warts, or growths Head:  no masses, lesions, or tenderness Eyes:  pupils equal and round, sclera anicteric without injection Ears:  canals without lesions, TMs shiny without retraction, no obvious effusion, no erythema Nose:  nares patent, septum midline, mucosa normal Throat/Pharynx:  lips and gingiva without lesion; tongue and uvula midline; non-inflamed pharynx; no exudates or postnasal drainage Neck: neck supple without adenopathy, thyromegaly, or masses Lungs:  clear to auscultation, breath sounds equal bilaterally, no respiratory distress Cardio:  regular rate and rhythm, no bruits, no LE edema Abdomen:  abdomen soft, nontender; bowel sounds normal; no masses or organomegaly Genital (male): circumcised penis, no lesions or discharge; testes present bilaterally without masses or tenderness Rectal: Deferred Musculoskeletal:  symmetrical muscle groups noted without atrophy or deformity Extremities:  no clubbing, cyanosis, or edema, no deformities, no skin discoloration Neuro:  gait normal; deep tendon reflexes normal and symmetric Psych: well oriented with normal range of affect and appropriate judgment/insight  Assessment and Plan  Well adult exam - Plan: Lipid panel, Comprehensive metabolic panel  Screening for HIV (human  immunodeficiency virus) - Plan: HIV antibody   Well 37 y.o. male. Counseled on diet and exercise. Do self testicular checks monthly. Other orders as above. Follow up for fasting labs. I am seeing him in the coming mo for a med check.  The patient voiced understanding and agreement to the plan.  Jilda Rocheicholas [place], DO 10/25/17 1:25  PM

## 2017-10-28 ENCOUNTER — Other Ambulatory Visit (INDEPENDENT_AMBULATORY_CARE_PROVIDER_SITE_OTHER): Payer: Federal, State, Local not specified - PPO

## 2017-10-28 DIAGNOSIS — Z114 Encounter for screening for human immunodeficiency virus [HIV]: Secondary | ICD-10-CM

## 2017-10-28 DIAGNOSIS — Z0000 Encounter for general adult medical examination without abnormal findings: Secondary | ICD-10-CM

## 2017-10-28 LAB — COMPREHENSIVE METABOLIC PANEL
ALK PHOS: 78 U/L (ref 39–117)
ALT: 25 U/L (ref 0–53)
AST: 21 U/L (ref 0–37)
Albumin: 4.5 g/dL (ref 3.5–5.2)
BILIRUBIN TOTAL: 1.2 mg/dL (ref 0.2–1.2)
BUN: 21 mg/dL (ref 6–23)
CALCIUM: 9.5 mg/dL (ref 8.4–10.5)
CO2: 28 mEq/L (ref 19–32)
Chloride: 105 mEq/L (ref 96–112)
Creatinine, Ser: 1.26 mg/dL (ref 0.40–1.50)
GFR: 68.06 mL/min (ref >60.00)
GLUCOSE: 104 mg/dL — AB (ref 70–99)
POTASSIUM: 4.6 meq/L (ref 3.5–5.1)
Sodium: 140 mEq/L (ref 135–145)
TOTAL PROTEIN: 7.2 g/dL (ref 6.0–8.3)

## 2017-10-28 LAB — LIPID PANEL
CHOLESTEROL: 155 mg/dL (ref 0–200)
HDL: 36.3 mg/dL — AB (ref >39.00)
LDL Cholesterol: 104 mg/dL — ABNORMAL HIGH (ref 0–99)
NONHDL: 119.12
TRIGLYCERIDES: 77 mg/dL (ref 0.0–149.0)
Total CHOL/HDL Ratio: 4
VLDL: 15.4 mg/dL (ref 0.0–40.0)

## 2017-10-29 LAB — HIV ANTIBODY (ROUTINE TESTING W REFLEX): HIV 1&2 Ab, 4th Generation: NONREACTIVE

## 2017-11-14 ENCOUNTER — Other Ambulatory Visit: Payer: Self-pay | Admitting: Family Medicine

## 2017-11-14 DIAGNOSIS — R41840 Attention and concentration deficit: Secondary | ICD-10-CM

## 2017-11-15 MED ORDER — DEXTROAMPHETAMINE SULFATE ER 15 MG PO CP24: 15.0000 mg | ORAL_CAPSULE | Freq: Every day | ORAL | 0 refills | Status: DC

## 2017-11-15 NOTE — Telephone Encounter (Signed)
Requesting: Dextroamphetamine 15mg  qday Contract: 2019 UDS: 09/25/17 Last OV: 10/25/17 Next Ov: N/A Last refill: 09/25/17, #30, 0RF Database: no discrepancies found  Please advise.

## 2017-12-03 ENCOUNTER — Ambulatory Visit: Payer: Federal, State, Local not specified - PPO | Admitting: Psychology

## 2017-12-03 DIAGNOSIS — F900 Attention-deficit hyperactivity disorder, predominantly inattentive type: Secondary | ICD-10-CM

## 2017-12-10 ENCOUNTER — Other Ambulatory Visit: Payer: Self-pay | Admitting: Family Medicine

## 2017-12-10 DIAGNOSIS — R41840 Attention and concentration deficit: Secondary | ICD-10-CM

## 2017-12-11 ENCOUNTER — Other Ambulatory Visit: Payer: Self-pay | Admitting: Family Medicine

## 2017-12-11 DIAGNOSIS — R41840 Attention and concentration deficit: Secondary | ICD-10-CM

## 2017-12-12 MED ORDER — DEXTROAMPHETAMINE SULFATE ER 15 MG PO CP24: 15.0000 mg | ORAL_CAPSULE | Freq: Every day | ORAL | 0 refills | Status: DC

## 2017-12-12 NOTE — Telephone Encounter (Signed)
Update

## 2017-12-12 NOTE — Addendum Note (Signed)
Addended by: Radene Gunning on: 12/12/2017 02:56 PM   Modules accepted: Orders

## 2017-12-12 NOTE — Telephone Encounter (Signed)
Patient informed. 

## 2017-12-12 NOTE — Telephone Encounter (Signed)
Pt called to f/u on denial for medication. He has 3 days left of his ADD medication. Please advise on reason for denial. He said that last time it took several days so he requested 5-6 days before running out.   [id number] - [place], [place] - [address]

## 2017-12-12 NOTE — Telephone Encounter (Signed)
Sent in, OK to fill tomorrow. TY.

## 2017-12-12 NOTE — Telephone Encounter (Signed)
Last refill was on 11/15/17 #30 no refills---requested yesterday 9//4/19 I denied due to being early. So this message is in regards to this request.

## 2017-12-30 ENCOUNTER — Ambulatory Visit: Payer: Federal, State, Local not specified - PPO | Admitting: Psychology

## 2017-12-30 ENCOUNTER — Encounter: Payer: Self-pay | Admitting: Family Medicine

## 2017-12-30 DIAGNOSIS — F900 Attention-deficit hyperactivity disorder, predominantly inattentive type: Secondary | ICD-10-CM

## 2018-01-03 DIAGNOSIS — Z302 Encounter for sterilization: Secondary | ICD-10-CM | POA: Diagnosis not present

## 2018-01-06 ENCOUNTER — Encounter: Payer: Self-pay | Admitting: Family Medicine

## 2018-01-23 ENCOUNTER — Encounter: Payer: Self-pay | Admitting: Family Medicine

## 2018-01-23 ENCOUNTER — Ambulatory Visit: Payer: Federal, State, Local not specified - PPO | Admitting: Family Medicine

## 2018-01-23 VITALS — BP 108/68 | HR 69 | Temp 98.0°F | Ht 69.0 in | Wt 192.5 lb

## 2018-01-23 DIAGNOSIS — R41840 Attention and concentration deficit: Secondary | ICD-10-CM

## 2018-01-23 DIAGNOSIS — F900 Attention-deficit hyperactivity disorder, predominantly inattentive type: Secondary | ICD-10-CM

## 2018-01-23 MED ORDER — DEXTROAMPHETAMINE SULFATE ER 15 MG PO CP24: 15.0000 mg | ORAL_CAPSULE | Freq: Every day | ORAL | 0 refills | Status: DC

## 2018-01-23 MED ORDER — DEXTROAMPHETAMINE SULFATE 10 MG PO TABS: ORAL_TABLET | ORAL | 0 refills | Status: DC

## 2018-01-23 NOTE — Progress Notes (Signed)
Chief Complaint  Patient presents with  . Medication Refill    Douglas Winters is 37 y.o. male here for ADHD follow up.  Patient is currently on Dexedrin 15 mg/d and compliance is excellent. Since her last visit, he had a formal evaluation by the psychology team and was diagnosed with ADHD, predominantly inattentive type. Symptoms include inattention. Side effects include none. Patient believes their dose should be increased in afternoon as it starts to wane around early afternoon. He does not take medication on days he does not work. Denies tics, weight loss, difficulties with sleep, self-medication, alcohol/drug abuse, chest pain, or palpitations.  ROS:  Heart- denies chest pain or palpitations Psych- as noted in HPI  Past Medical History:  Diagnosis Date  . ADHD (attention deficit hyperactivity disorder), inattentive type     BP 108/68 (BP Location: Left Arm, Patient Position: Sitting, Cuff Size: Normal)   Pulse 69   Temp 98 F (36.7 C) (Oral)   Ht 5\' 9"  (1.753 m)   Wt 192 lb 8 oz (87.3 kg)   SpO2 99%   BMI 28.43 kg/m  Gen- awake, alert, appearing stated age HEENT- PERRLA, MMM Heart- RRR, no murmurs, no bruits Lungs- CTAB, no accessory muscle use Abd- soft, NT, ND, no masses or organomegaly Neuro- no facial tics Psych- age appropriate judgment and insight, normal mood and affect  ADHD (attention deficit hyperactivity disorder), inattentive type - Plan: dextroamphetamine (DEXTROSTAT) 10 MG tablet, dextroamphetamine (DEXEDRINE SPANSULE) 15 MG 24 hr capsule, dextroamphetamine (DEXEDRINE SPANSULE) 15 MG 24 hr capsule, dextroamphetamine (DEXEDRINE SPANSULE) 15 MG 24 hr capsule  Inattention  Add short acting tablet in the afternoon. [facility] contract signed today. F/u in 1 mo to recheck, ck UDS at next visit. Pt voiced understanding and agreement to the plan.  Jilda Roche [place], DO 01/23/18 9:50 AM

## 2018-01-23 NOTE — Patient Instructions (Addendum)
Keep up the good work.   Let me know if you change your mind about the flu shot.  Let us know if you need anything.

## 2018-01-23 NOTE — Progress Notes (Signed)
Pre visit review using our clinic review tool, if applicable. No additional management support is needed unless otherwise documented below in the visit note. 

## 2018-01-28 DIAGNOSIS — K080 Exfoliation of teeth due to systemic causes: Secondary | ICD-10-CM | POA: Diagnosis not present

## 2018-02-10 ENCOUNTER — Encounter: Payer: Self-pay | Admitting: Family Medicine

## 2018-02-12 ENCOUNTER — Telehealth: Payer: Self-pay

## 2018-02-12 NOTE — Telephone Encounter (Signed)
PA initiated via Covermymeds; KEY: [id number]. Awaiting determination.

## 2018-02-12 NOTE — Telephone Encounter (Signed)
PA approved. Effective 01/13/2018 to 02/12/2019.

## 2018-04-28 IMAGING — MR MR LUMBAR SPINE W/O CM
4 of 5 series · 19 of 48 positions shown · non-contrast
Comparison: Radiography 11/28/2015

CLINICAL DATA: Chronic low back pain, worse on the right.

EXAM:
MRI LUMBAR SPINE WITHOUT CONTRAST
TECHNIQUE: Multiplanar, multisequence MR imaging of the lumbar spine was
performed. No intravenous contrast was administered.

[Series 6: T2 · sagittal · 4.0mm · 0.73mm/px · 7 of 15 slices shown (1 of 2)]
[im 1/15]
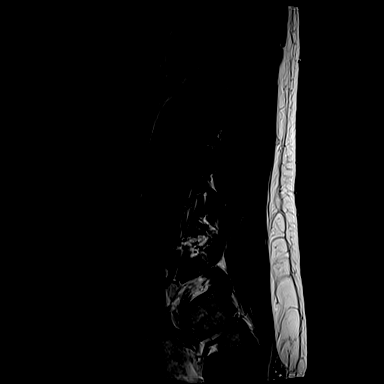
[im 3/15]
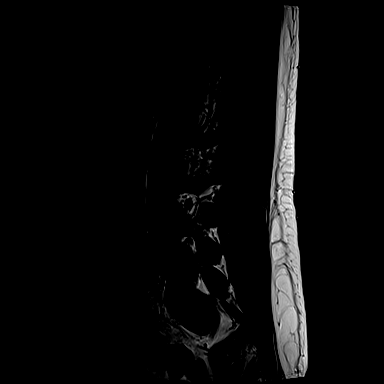
[im 5/15]
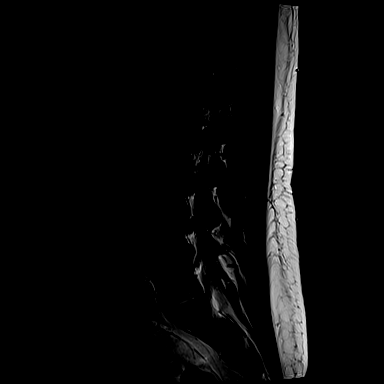
[im 8/15]
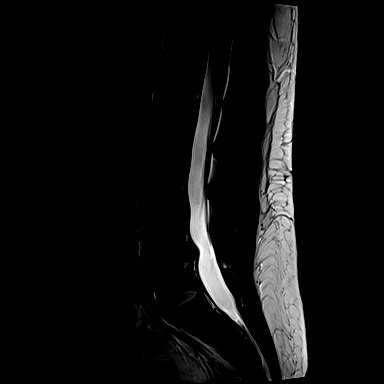
[im 10/15]
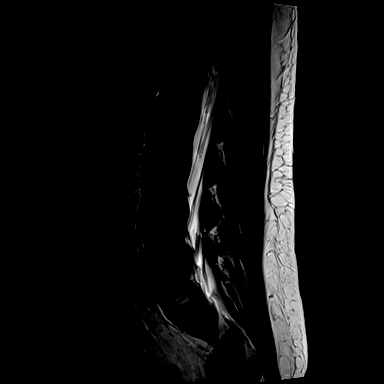
[im 12/15]
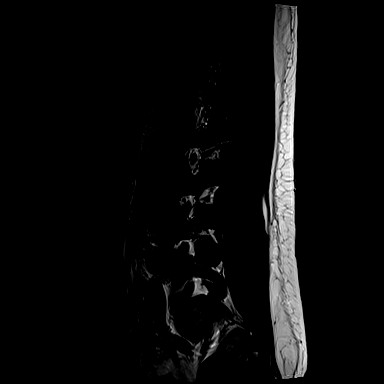
[im 15/15]
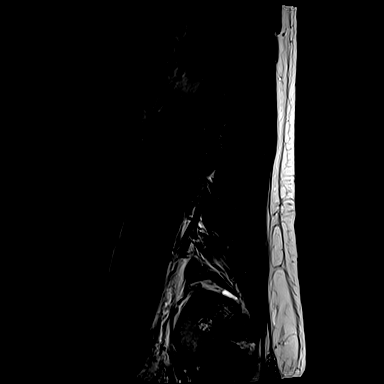

[Series 7: T1 · sagittal · 4.0mm · 0.73mm/px · 3 of 15 slices shown (1 of 2)]
[im 3/15]
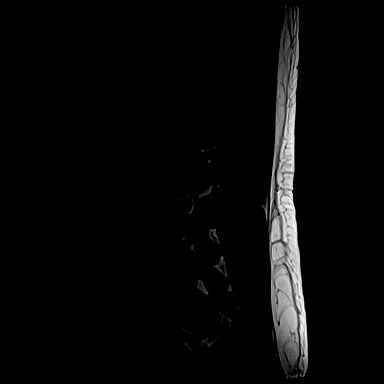
[im 8/15]
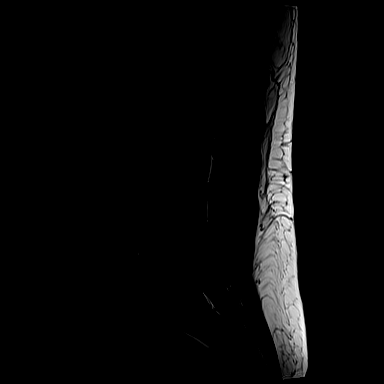
[im 12/15]
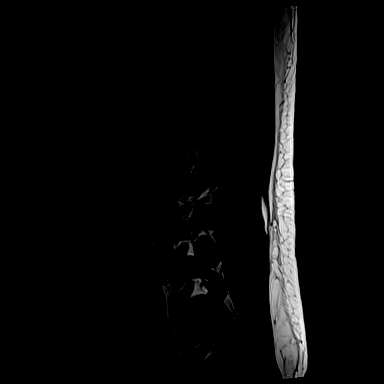

[Series 11: T1 · axial · 4.0mm · 0.28mm/px · z∈[-40,+93]mm · 3 of 33 slices shown (2 of 2)]
[im 5/33]
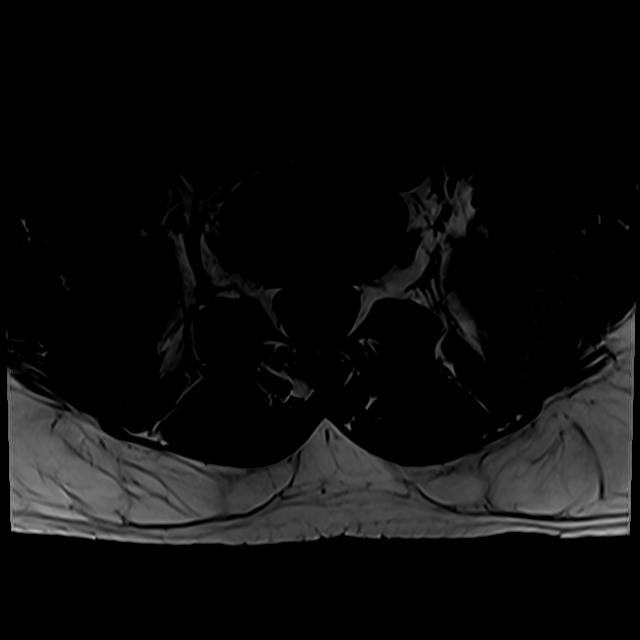
[im 18/33]
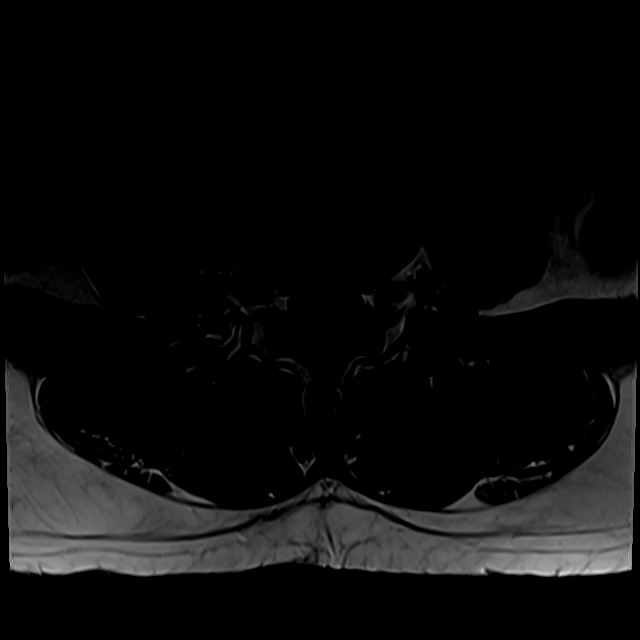
[im 28/33]
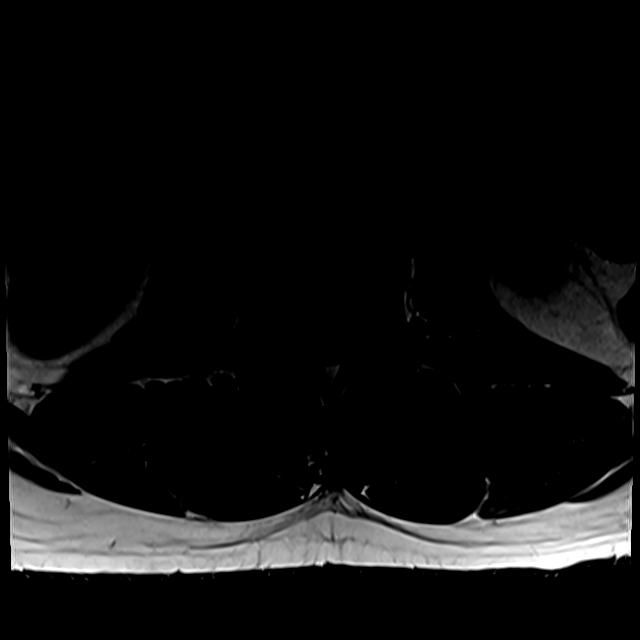

[Series 14: T2 · axial · 4.0mm · 0.28mm/px · z∈[-59,+93]mm · 6 of 33 slices shown (2 of 2)]
[im 1/33]
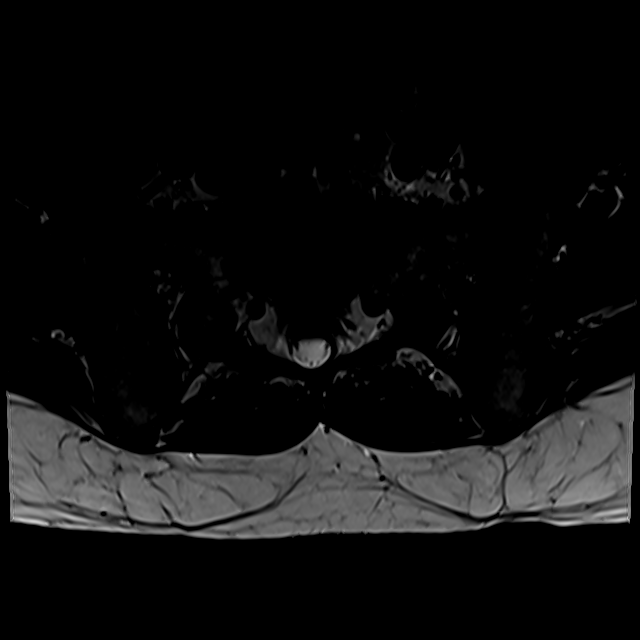
[im 5/33]
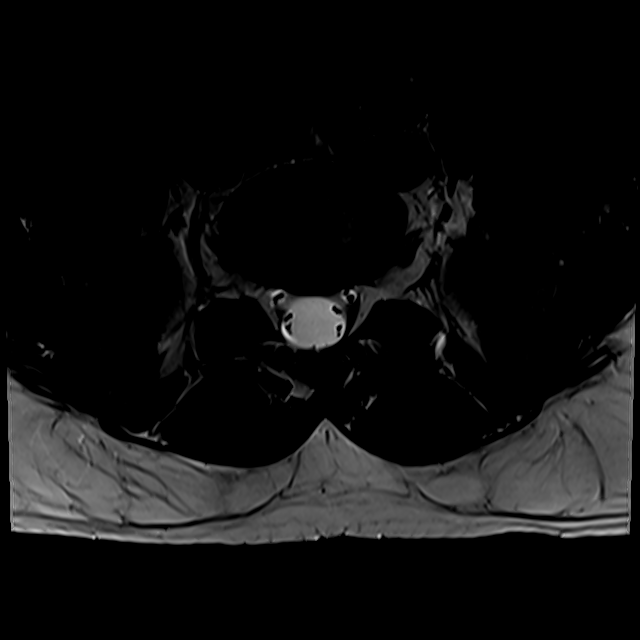
[im 10/33]
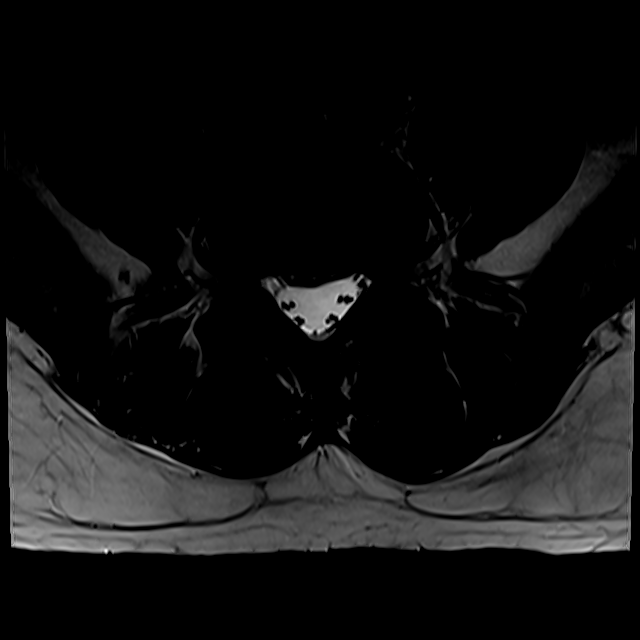
[im 15/33]
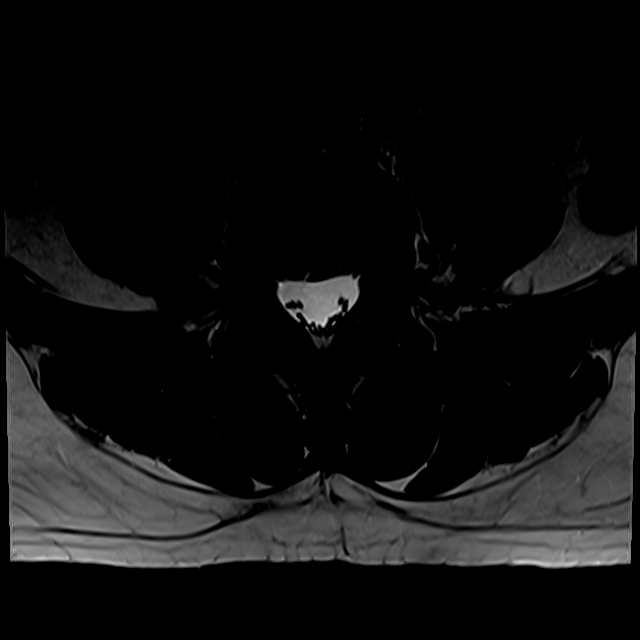
[im 18/33]
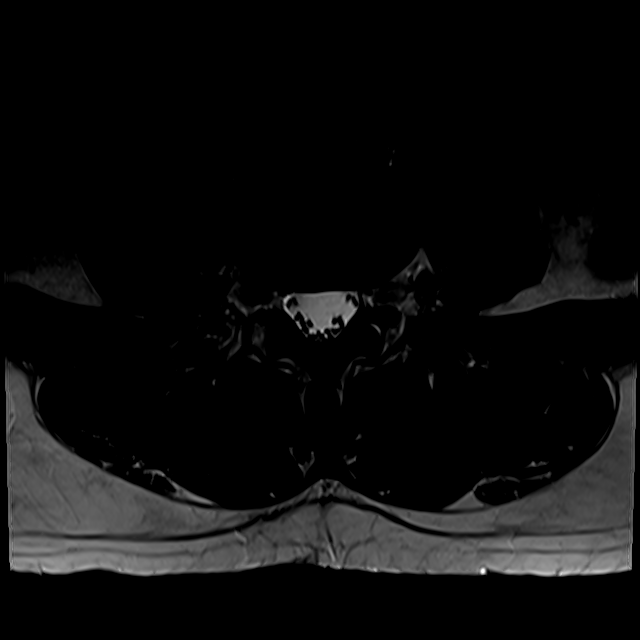
[im 28/33]
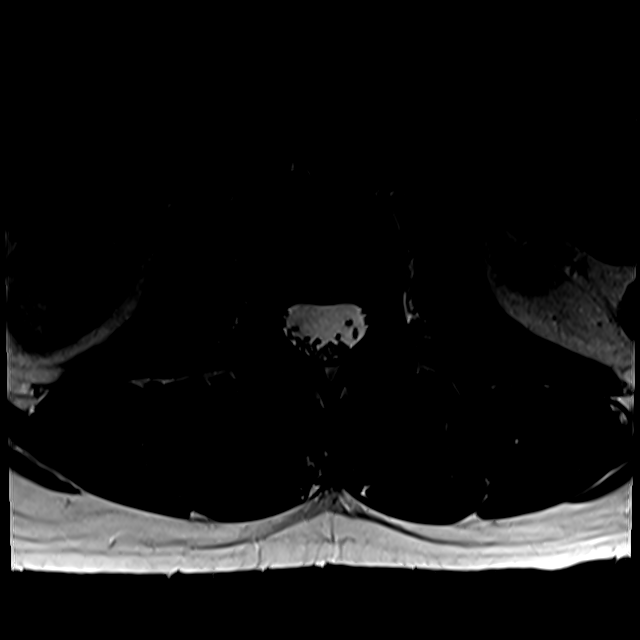

[19 of 48 positions shown; findings below may reference images not displayed]

FINDINGS: Segmentation:  5 lumbar type vertebral bodies.

Alignment:  Normal

Vertebrae:  No fracture or primary bone lesion.

Conus medullaris: Extends to the L1 level and appears normal.

Paraspinal and other soft tissues: Negative

Disc levels:

No abnormality at L2-3 or above.

L3-4: Disc degeneration with annular fissures an annular bulging. No
compressive stenosis. Mild facet osteoarthritis.

L4-5: Disc degeneration with loss of disc height. Broad-based disc
herniation slightly more prominent towards the right. This indents
the thecal sac. Definite neural compression is not demonstrated, but
neural irritation could occur. There is discogenic edema of the
vertebral body endplates which could be associated with back pain.
There is facet osteoarthritis at this level.

L5-S1:  Normal interspace.
IMPRESSION: L4-5: Disc degeneration with broad-based disc herniation more
prominent towards the right, indenting the thecal sac. Neural
compression is not demonstrated, but neural irritation could occur.
Discogenic edema of the vertebral body marrow. These findings could
certainly be associated with back pain. There is also facet
osteoarthritis, which could contribute to back pain.

L3-4: Annular fissures an annular bulging. No stenosis or neural
compression. Mild facet osteoarthritis. These findings could
contribute to back pain.

## 2018-05-12 ENCOUNTER — Encounter: Payer: Self-pay | Admitting: Family Medicine

## 2018-05-12 DIAGNOSIS — F900 Attention-deficit hyperactivity disorder, predominantly inattentive type: Secondary | ICD-10-CM

## 2018-05-12 MED ORDER — DEXTROAMPHETAMINE SULFATE ER 15 MG PO CP24: 15.0000 mg | ORAL_CAPSULE | Freq: Every day | ORAL | 0 refills | Status: DC

## 2018-11-06 ENCOUNTER — Encounter: Payer: Self-pay | Admitting: Family Medicine

## 2018-11-18 ENCOUNTER — Encounter: Payer: Self-pay | Admitting: Family Medicine

## 2018-11-18 ENCOUNTER — Ambulatory Visit (INDEPENDENT_AMBULATORY_CARE_PROVIDER_SITE_OTHER): Payer: Federal, State, Local not specified - PPO | Admitting: Family Medicine

## 2018-11-18 ENCOUNTER — Other Ambulatory Visit: Payer: Self-pay

## 2018-11-18 DIAGNOSIS — F900 Attention-deficit hyperactivity disorder, predominantly inattentive type: Secondary | ICD-10-CM | POA: Diagnosis not present

## 2018-11-18 MED ORDER — DEXTROAMPHETAMINE SULFATE ER 15 MG PO CP24: 15.0000 mg | ORAL_CAPSULE | Freq: Every day | ORAL | 0 refills | Status: DC

## 2018-11-18 NOTE — Progress Notes (Signed)
Chief Complaint  Patient presents with  . Follow-up    Douglas Winters is 38 y.o. male here for ADHD follow up. Due to COVID-19 pandemic, we are interacting via web portal for an electronic face-to-face visit. I verified patient's ID using 2 identifiers. Patient agreed to proceed with visit via this method. Patient is at home, I am at office. Patient and I are present for visit.   Patient is currently on Dexedrine 15 mg/d and compliance is excellent. Symptoms are well controlled and med lasts long enough. Side effects include: none. Takes during work week.  Patient believes their dose should be unchanged. Denies tics, weight loss, difficulties with sleep, self-medication, alcohol/drug abuse, chest pain, or palpitations.  ROS:  Heart- denies chest pain or palpitations Psych- as noted in HPI  Past Medical History:  Diagnosis Date  . ADHD (attention deficit hyperactivity disorder), inattentive type    Exam No conversational dyspnea Age appropriate judgment and insight Nml affect and mood  ADHD (attention deficit hyperactivity disorder), inattentive type - Plan: dextroamphetamine (DEXEDRINE SPANSULE) 15 MG 24 hr capsule, dextroamphetamine (DEXEDRINE SPANSULE) 15 MG 24 hr capsule, dextroamphetamine (DEXEDRINE SPANSULE) 15 MG 24 hr capsule  F/u in 6 mo for ADHD recheck. Pt voiced understanding and agreement to the plan.  [place], DO 11/18/18 8:34 AM

## 2019-02-10 ENCOUNTER — Telehealth: Payer: Self-pay

## 2019-02-10 NOTE — Telephone Encounter (Signed)
PA approved. Effective 01/11/2019 to 02/10/2020.

## 2019-02-10 NOTE — Telephone Encounter (Signed)
PA initiated via Covermymeds; KEY: [id number]. Awaiting determination.

## 2019-03-20 ENCOUNTER — Telehealth: Payer: Self-pay | Admitting: Family Medicine

## 2019-03-20 NOTE — Telephone Encounter (Signed)
Medication Refill - Medication: adderall   Has the patient contacted their pharmacy? Yes.   (Agent: If no, request that the patient contact the pharmacy for the refill.) (Agent: If yes, when and what did the pharmacy advise?)  Preferred Pharmacy (with phone number or street name):  [id number] - [place], [place] - [address] [place]  [address] [place] [address]  Phone: [phone] Fax: [phone]  Not a 24 hour pharmacy; exact hours not known.     Agent: Please be advised that RX refills may take up to 3 business days. We ask that you follow-up with your pharmacy.

## 2019-03-25 ENCOUNTER — Encounter: Payer: Self-pay | Admitting: Family Medicine

## 2019-03-25 DIAGNOSIS — F900 Attention-deficit hyperactivity disorder, predominantly inattentive type: Secondary | ICD-10-CM

## 2019-03-25 MED ORDER — DEXTROAMPHETAMINE SULFATE ER 15 MG PO CP24: 15.0000 mg | ORAL_CAPSULE | Freq: Every day | ORAL | 0 refills | Status: DC

## 2019-03-30 ENCOUNTER — Telehealth: Payer: Self-pay | Admitting: *Deleted

## 2019-03-30 ENCOUNTER — Encounter: Payer: Self-pay | Admitting: Family Medicine

## 2019-03-30 NOTE — Telephone Encounter (Signed)
Copied from [place] [id number]. Topic: General - Other >> Mar 30, 2019 10:31 AM Leward Quan A wrote: Reason for CRM: Patient called to say that his pharmacy require Prior authorization for him to get the medication dextroamphetamine (DEXEDRINE SPANSULE) 15 MG 24 hr capsule asking for this to be done and for him to get a call back please Ph# [phone]

## 2019-04-07 NOTE — Telephone Encounter (Signed)
Called [phone] to initiate PA for Dexedrin 15 mg ER generic for Dextroamphetamine. Medication has been approved from March 08, 2019 through April 06, 2020. The pharmacy has been contacted of approval and Also, called the patient to inform medication has been approved.  Once we receive approval letter from pharmacy/insurance will scan into chart. The patient contacted by phone and my chart message.

## 2019-05-14 ENCOUNTER — Encounter: Payer: Self-pay | Admitting: Family Medicine

## 2019-05-20 ENCOUNTER — Encounter: Payer: Self-pay | Admitting: Family Medicine

## 2019-05-22 ENCOUNTER — Encounter: Payer: Federal, State, Local not specified - PPO | Admitting: Family Medicine

## 2019-06-25 ENCOUNTER — Encounter: Payer: Self-pay | Admitting: Family Medicine

## 2019-07-03 ENCOUNTER — Other Ambulatory Visit: Payer: Self-pay

## 2019-07-06 ENCOUNTER — Encounter: Payer: Self-pay | Admitting: Family Medicine

## 2019-07-06 ENCOUNTER — Ambulatory Visit (INDEPENDENT_AMBULATORY_CARE_PROVIDER_SITE_OTHER): Payer: Federal, State, Local not specified - PPO | Admitting: Family Medicine

## 2019-07-06 ENCOUNTER — Other Ambulatory Visit: Payer: Self-pay

## 2019-07-06 VITALS — BP 118/68 | HR 76 | Temp 95.5°F | Ht 69.0 in | Wt 225.1 lb

## 2019-07-06 DIAGNOSIS — Z79899 Other long term (current) drug therapy: Secondary | ICD-10-CM | POA: Diagnosis not present

## 2019-07-06 DIAGNOSIS — F900 Attention-deficit hyperactivity disorder, predominantly inattentive type: Secondary | ICD-10-CM

## 2019-07-06 DIAGNOSIS — Z0000 Encounter for general adult medical examination without abnormal findings: Secondary | ICD-10-CM | POA: Diagnosis not present

## 2019-07-06 LAB — CBC
HCT: 41.1 % (ref 39.0–52.0)
Hemoglobin: 14.2 g/dL (ref 13.0–17.0)
MCHC: 34.6 g/dL (ref 30.0–36.0)
MCV: 92.5 fl (ref 78.0–100.0)
Platelets: 226 10*3/uL (ref 150.0–400.0)
RBC: 4.45 Mil/uL (ref 4.22–5.81)
RDW: 13.1 % (ref 11.5–15.5)
WBC: 4.2 10*3/uL (ref 4.0–10.5)

## 2019-07-06 LAB — COMPREHENSIVE METABOLIC PANEL
ALT: 43 U/L (ref 0–53)
AST: 29 U/L (ref 0–37)
Albumin: 4.3 g/dL (ref 3.5–5.2)
Alkaline Phosphatase: 70 U/L (ref 39–117)
BUN: 16 mg/dL (ref 6–23)
CO2: 29 mEq/L (ref 19–32)
Calcium: 9 mg/dL (ref 8.4–10.5)
Chloride: 105 mEq/L (ref 96–112)
Creatinine, Ser: 1.18 mg/dL (ref 0.40–1.50)
GFR: 68.46 mL/min (ref >60.00)
Glucose, Bld: 97 mg/dL (ref 70–99)
Potassium: 4.3 mEq/L (ref 3.5–5.1)
Sodium: 139 mEq/L (ref 135–145)
Total Bilirubin: 0.8 mg/dL (ref 0.2–1.2)
Total Protein: 6.6 g/dL (ref 6.0–8.3)

## 2019-07-06 LAB — LIPID PANEL
Cholesterol: 191 mg/dL (ref 0–200)
HDL: 49.1 mg/dL (ref >39.00)
LDL Cholesterol: 122 mg/dL — ABNORMAL HIGH (ref 0–99)
NonHDL: 142.28
Total CHOL/HDL Ratio: 4
Triglycerides: 102 mg/dL (ref 0.0–149.0)
VLDL: 20.4 mg/dL (ref 0.0–40.0)

## 2019-07-06 MED ORDER — DEXTROAMPHETAMINE SULFATE ER 15 MG PO CP24: 15.0000 mg | ORAL_CAPSULE | Freq: Every day | ORAL | 0 refills | Status: DC

## 2019-07-06 NOTE — Progress Notes (Signed)
Chief Complaint  Patient presents with  . Annual Exam    Well Male Douglas Winters is here for a complete physical.   His last physical was >1 year ago.  Current diet: in general, diet could be better.   Current exercise: some walking Weight trend: has gained Daytime fatigue? No. Seat belt? Yes.    Health maintenance Tetanus- Yes HIV- Yes  Past Medical History:  Diagnosis Date  . ADHD (attention deficit hyperactivity disorder), inattentive type      Past Surgical History:  Procedure Laterality Date  . EYE MUSCLE SURGERY    . NASAL SEPTUM SURGERY    . WISDOM TOOTH EXTRACTION      Medications  Current Outpatient Medications on File Prior to Visit  Medication Sig Dispense Refill  . dextroamphetamine (DEXEDRINE SPANSULE) 15 MG 24 hr capsule Take 1 capsule (15 mg total) by mouth daily. 30 capsule 0  . dextroamphetamine (DEXEDRINE SPANSULE) 15 MG 24 hr capsule Take 1 capsule (15 mg total) by mouth daily. 30 capsule 0  . dextroamphetamine (DEXEDRINE SPANSULE) 15 MG 24 hr capsule Take 1 capsule (15 mg total) by mouth daily. 30 capsule 0   Allergies No Known Allergies  Family History Family History  Problem Relation Age of Onset  . Healthy Mother   . Asthma Father        Childhood  . Diabetes Maternal Grandmother   . Clotting disorder Paternal Grandfather        Deceased  . Alcoholism Paternal Grandfather   . Healthy Brother   . Amblyopia Child        x2    Review of Systems: Constitutional: no fevers or chills Eye:  no recent significant change in vision Ear/Nose/Mouth/Throat:  Ears:  no hearing loss Nose/Mouth/Throat:  no complaints of nasal congestion, no sore throat Cardiovascular:  no chest pain Respiratory:  no shortness of breath Gastrointestinal:  no abdominal pain, no change in bowel habits GU:  Male: negative for dysuria, frequency, and incontinence Musculoskeletal/Extremities:  no pain of the joints Integumentary (Skin/Breast):  no abnormal skin  lesions reported Neurologic:  no headaches Endocrine: No wt loss Hematologic/Lymphatic:  no night sweats  Exam BP 118/68 (BP Location: Left Arm, Patient Position: Sitting, Cuff Size: Normal)   Pulse 76   Temp (!) 95.5 F (35.3 C) (Temporal)   Ht 5\' 9"  (1.753 m)   Wt 225 lb 2 oz (102.1 kg)   SpO2 99%   BMI 33.25 kg/m  General:  well developed, well nourished, in no apparent distress Skin:  no significant moles, warts, or growths Head:  no masses, lesions, or tenderness Eyes:  pupils equal and round, sclera anicteric without injection Ears:  canals without lesions, TMs shiny without retraction, no obvious effusion, no erythema Nose:  nares patent, septum midline, mucosa normal Throat/Pharynx:  lips and gingiva without lesion; tongue and uvula midline; non-inflamed pharynx; no exudates or postnasal drainage Neck: neck supple without adenopathy, thyromegaly, or masses Lungs:  clear to auscultation, breath sounds equal bilaterally, no respiratory distress Cardio:  regular rate and rhythm, no bruits, no LE edema Abdomen:  abdomen soft, nontender; bowel sounds normal; no masses or organomegaly Rectal: Deferred Musculoskeletal:  symmetrical muscle groups noted without atrophy or deformity Extremities:  no clubbing, cyanosis, or edema, no deformities, no skin discoloration Neuro:  gait normal; deep tendon reflexes normal and symmetric Psych: well oriented with normal range of affect and appropriate judgment/insight  Assessment and Plan  Well adult exam - Plan: CBC, Comprehensive  metabolic panel, Lipid panel  ADHD (attention deficit hyperactivity disorder), inattentive type - Plan: dextroamphetamine (DEXEDRINE SPANSULE) 15 MG 24 hr capsule, dextroamphetamine (DEXEDRINE SPANSULE) 15 MG 24 hr capsule, dextroamphetamine (DEXEDRINE SPANSULE) 15 MG 24 hr capsule, Pain Mgmt, Profile 8 w/Conf, U  Encounter for long-term (current) use of high-risk medication - Plan: Pain Mgmt, Profile 8 w/Conf, U    Well 39 y.o. male. Counseled on diet and exercise. Self testicular exams recommended at least monthly.  Other orders as above. Follow up in 6 mo pending the above workup. The patient voiced understanding and agreement to the plan.  Jilda Roche [place], DO 07/06/19 8:29 AM

## 2019-07-06 NOTE — Patient Instructions (Addendum)
Give us 2-3 business days to get the results of your labs back.   Keep the diet clean and stay active.  Do monthly self testicular checks in the shower. You are feeling for lumps/bumps that don't belong. If you feel anything like this, let me know!  Let us know if you need anything. 

## 2019-07-07 LAB — PAIN MGMT, PROFILE 8 W/CONF, U
6 Acetylmorphine: NEGATIVE ng/mL
Alcohol Metabolites: NEGATIVE ng/mL (ref <500)
Amphetamines: NEGATIVE ng/mL
Benzodiazepines: NEGATIVE ng/mL
Buprenorphine, Urine: NEGATIVE ng/mL
Cocaine Metabolite: NEGATIVE ng/mL
Creatinine: 130.5 mg/dL
MDMA: NEGATIVE ng/mL
Marijuana Metabolite: NEGATIVE ng/mL
Opiates: NEGATIVE ng/mL
Oxidant: NEGATIVE ug/mL
Oxycodone: NEGATIVE ng/mL
pH: 5.1 (ref 4.5–9.0)

## 2020-01-11 ENCOUNTER — Other Ambulatory Visit: Payer: Self-pay

## 2020-01-11 ENCOUNTER — Telehealth (INDEPENDENT_AMBULATORY_CARE_PROVIDER_SITE_OTHER): Payer: Federal, State, Local not specified - PPO | Admitting: Family Medicine

## 2020-01-11 ENCOUNTER — Encounter: Payer: Self-pay | Admitting: Family Medicine

## 2020-01-11 VITALS — BP 116/78 | HR 72 | Temp 98.1°F | Ht 69.0 in | Wt 221.4 lb

## 2020-01-11 DIAGNOSIS — F900 Attention-deficit hyperactivity disorder, predominantly inattentive type: Secondary | ICD-10-CM | POA: Diagnosis not present

## 2020-01-11 MED ORDER — DEXTROAMPHETAMINE SULFATE ER 15 MG PO CP24: 15.0000 mg | ORAL_CAPSULE | Freq: Every day | ORAL | 0 refills | Status: DC

## 2020-01-11 NOTE — Progress Notes (Signed)
Chief Complaint  Patient presents with  . Follow-up    6 month    Douglas Winters is 39 y.o. male here for ADHD follow up. Due to COVID-19 pandemic, we are interacting via web portal for an electronic face-to-face visit. I verified patient's ID using 2 identifiers. Patient agreed to proceed with visit via this method. Patient is at home, I am at office. Patient and I are present for visit.   Patient is currently on Dexedrine 15 mg/d and compliance is excellent. Symptoms are well controlled at current dosage, only takes on days he works.  Side effects include: none. Patient believes their dose should be unchanged. Denies tics, weight loss, difficulties with sleep, self-medication, alcohol/drug abuse, chest pain, or palpitations.   Past Medical History:  Diagnosis Date  . ADHD (attention deficit hyperactivity disorder), inattentive type     BP 116/78 (BP Location: Left Arm, Patient Position: Sitting, Cuff Size: Normal)   Pulse 72   Temp 98.1 F (36.7 C) (Oral)   Ht 5\' 9"  (1.753 m)   Wt 221 lb 7 oz (100.4 kg)   BMI 32.70 kg/m  No conversational dyspnea Age appropriate judgment and insight Nml affect and mood  ADHD (attention deficit hyperactivity disorder), inattentive type - Plan: dextroamphetamine (DEXEDRINE SPANSULE) 15 MG 24 hr capsule, dextroamphetamine (DEXEDRINE SPANSULE) 15 MG 24 hr capsule, dextroamphetamine (DEXEDRINE SPANSULE) 15 MG 24 hr capsule  Stable. Cont Dexedrine. CSC and UDS at next visit in 6 mo.  F/u in 6 mo for CPE. Pt voiced understanding and agreement to the plan.  [place], DO 01/11/20 8:09 AM

## 2020-05-10 ENCOUNTER — Telehealth: Payer: Self-pay

## 2020-05-10 NOTE — Telephone Encounter (Signed)
PA initiated via Covermymeds; KEY: [id number]. PA approved.  Effective 04/10/2020 to 05/10/2021

## 2020-05-24 ENCOUNTER — Telehealth: Payer: Self-pay | Admitting: Family Medicine

## 2020-05-24 DIAGNOSIS — F900 Attention-deficit hyperactivity disorder, predominantly inattentive type: Secondary | ICD-10-CM

## 2020-05-24 MED ORDER — DEXTROAMPHETAMINE SULFATE ER 15 MG PO CP24: 15.0000 mg | ORAL_CAPSULE | Freq: Every day | ORAL | 0 refills | Status: DC

## 2020-05-24 NOTE — Telephone Encounter (Signed)
Last OV---01/11/2020 Last RF--03/11/2020 UDS/CSC---07/06/2019

## 2020-05-24 NOTE — Telephone Encounter (Signed)
Medication: dextroamphetamine (DEXEDRINE SPANSULE) 15 MG 24 hr capsule [[id number]]       Has the patient contacted their pharmacy?  (If no, request that the patient contact the pharmacy for the refill.) (If yes, when and what did the pharmacy advise?)     Preferred Pharmacy (with phone number or street name):[id number] - [place], [place] - [address]  [address], [place] [address]  Phone:  [phone] Fax:  [phone]  DEA #:  [id number]     Agent: Please be advised that RX refills may take up to 3 business days. We ask that you follow-up with your pharmacy.

## 2020-09-18 DIAGNOSIS — S61432A Puncture wound without foreign body of left hand, initial encounter: Secondary | ICD-10-CM | POA: Diagnosis not present

## 2021-05-02 ENCOUNTER — Encounter: Payer: Self-pay | Admitting: Family Medicine

## 2021-05-02 ENCOUNTER — Ambulatory Visit (INDEPENDENT_AMBULATORY_CARE_PROVIDER_SITE_OTHER): Payer: Federal, State, Local not specified - PPO | Admitting: Family Medicine

## 2021-05-02 VITALS — BP 118/62 | HR 86 | Temp 98.6°F | Ht 68.0 in | Wt 242.5 lb

## 2021-05-02 DIAGNOSIS — Z79899 Other long term (current) drug therapy: Secondary | ICD-10-CM | POA: Diagnosis not present

## 2021-05-02 DIAGNOSIS — Z0000 Encounter for general adult medical examination without abnormal findings: Secondary | ICD-10-CM

## 2021-05-02 DIAGNOSIS — Z1159 Encounter for screening for other viral diseases: Secondary | ICD-10-CM | POA: Diagnosis not present

## 2021-05-02 DIAGNOSIS — F900 Attention-deficit hyperactivity disorder, predominantly inattentive type: Secondary | ICD-10-CM

## 2021-05-02 MED ORDER — DEXTROAMPHETAMINE SULFATE ER 15 MG PO CP24: 15.0000 mg | ORAL_CAPSULE | Freq: Every day | ORAL | 0 refills | Status: DC

## 2021-05-02 NOTE — Addendum Note (Signed)
Addended by: Sharon Seller B on: 05/02/2021 01:59 PM   Modules accepted: Orders

## 2021-05-02 NOTE — Patient Instructions (Signed)
Give us 2-3 business days to get the results of your labs back.   Keep the diet clean and stay active.  I recommend getting the updated bivalent covid vaccination booster at your convenience.   Let us know if you need anything. 

## 2021-05-02 NOTE — Progress Notes (Signed)
Chief Complaint  Patient presents with   Annual Exam    Well Male Douglas Winters is here for a complete physical.   His last physical was >1 year ago.  Current diet: in general, diet has been healthier.   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 No. Seat belt? Yes.   Advanced directive? No  Health maintenance Tetanus- Yes HIV- Yes Hep C- No  Past Medical History:  Diagnosis Date   ADHD (attention deficit hyperactivity disorder), inattentive type      Past Surgical History:  Procedure Laterality Date   EYE MUSCLE SURGERY     NASAL SEPTUM SURGERY     WISDOM TOOTH EXTRACTION      Medications  Current Outpatient Medications on File Prior to Visit  Medication Sig Dispense Refill   dextroamphetamine (DEXEDRINE SPANSULE) 15 MG 24 hr capsule Take 1 capsule (15 mg total) by mouth daily. (Patient not taking: Reported on 05/02/2021) 30 capsule 0   dextroamphetamine (DEXEDRINE SPANSULE) 15 MG 24 hr capsule Take 1 capsule (15 mg total) by mouth daily. (Patient not taking: Reported on 05/02/2021) 30 capsule 0   dextroamphetamine (DEXEDRINE SPANSULE) 15 MG 24 hr capsule Take 1 capsule (15 mg total) by mouth daily. (Patient not taking: Reported on 05/02/2021) 30 capsule 0   Allergies No Known Allergies  Family History Family History  Problem Relation Age of Onset   Healthy Mother    Asthma Father        Childhood   Diabetes Maternal Grandmother    Clotting disorder Paternal Grandfather        Deceased   Alcoholism Paternal Grandfather    Healthy Brother    Amblyopia Child        x2    Review of Systems: Constitutional: no fevers or chills Eye:  no recent significant change in vision Ear/Nose/Mouth/Throat:  Ears:  no hearing loss Nose/Mouth/Throat:  no complaints of nasal congestion, no sore throat Cardiovascular:  no chest pain Respiratory:  no shortness of breath Gastrointestinal:  no  abdominal pain, no change in bowel habits GU:  Male: negative for dysuria, frequency, and incontinence Musculoskeletal/Extremities:  no pain of the joints Integumentary (Skin/Breast):  no abnormal skin lesions reported Neurologic:  no headaches Endocrine: No unexpected weight changes Hematologic/Lymphatic:  no night sweats  Exam BP 118/62    Pulse 86    Temp 98.6 F (37 C) (Oral)    Ht 5\' 8"  (1.727 m)    Wt 242 lb 8 oz (110 kg)    SpO2 97%    BMI 36.87 kg/m  General:  well developed, well nourished, in no apparent distress Skin:  no significant moles, warts, or growths Head:  no masses, lesions, or tenderness Eyes:  pupils equal and round, sclera anicteric without injection Ears:  canals without lesions, TMs shiny without retraction, no obvious effusion, no erythema Nose:  nares patent, septum midline, mucosa normal Throat/Pharynx:  lips and gingiva without lesion; tongue and uvula midline; non-inflamed pharynx; no exudates or postnasal drainage Neck: neck supple without adenopathy, thyromegaly, or masses Lungs:  clear to auscultation, breath sounds equal bilaterally, no respiratory distress Cardio:  regular rate and rhythm, no bruits, no LE edema Abdomen:  abdomen soft, nontender; bowel sounds normal; no masses or organomegaly Rectal: Deferred Musculoskeletal:  symmetrical muscle groups noted without atrophy or deformity Extremities:  no clubbing, cyanosis, or edema, no deformities, no skin discoloration Neuro:  gait normal; deep  tendon reflexes normal and symmetric Psych: well oriented with normal range of affect and appropriate judgment/insight  Assessment and Plan  Well adult exam - Plan: CBC, Comprehensive metabolic panel, Lipid panel  ADHD (attention deficit hyperactivity disorder), inattentive type - Plan: dextroamphetamine (DEXEDRINE SPANSULE) 15 MG 24 hr capsule, dextroamphetamine (DEXEDRINE SPANSULE) 15 MG 24 hr capsule, dextroamphetamine (DEXEDRINE SPANSULE) 15 MG 24 hr  capsule  Encounter for hepatitis C screening test for low risk patient - Plan: Hepatitis C antibody  Encounter for long-term (current) use of high-risk medication - Plan: Drug Monitoring Panel [phone] , Urine   Well 41 y.o. male. Counseled on diet and exercise. Bivalent COVID vaccination booster recommended.  Restart Dexedrine, updated CSC and UDS.  Advanced directive form provided today.  Other orders as above, future labs placed so he can be fasting. Follow up in 6 mo pending the above workup. The patient voiced understanding and agreement to the plan.  [place], DO 05/02/21 1:58 PM

## 2021-05-05 ENCOUNTER — Other Ambulatory Visit (INDEPENDENT_AMBULATORY_CARE_PROVIDER_SITE_OTHER): Payer: Federal, State, Local not specified - PPO

## 2021-05-05 DIAGNOSIS — Z79899 Other long term (current) drug therapy: Secondary | ICD-10-CM

## 2021-05-05 DIAGNOSIS — Z1159 Encounter for screening for other viral diseases: Secondary | ICD-10-CM | POA: Diagnosis not present

## 2021-05-05 DIAGNOSIS — Z0000 Encounter for general adult medical examination without abnormal findings: Secondary | ICD-10-CM | POA: Diagnosis not present

## 2021-05-05 LAB — CBC
HCT: 42.8 % (ref 39.0–52.0)
Hemoglobin: 14.6 g/dL (ref 13.0–17.0)
MCHC: 34 g/dL (ref 30.0–36.0)
MCV: 91.1 fl (ref 78.0–100.0)
Platelets: 258 10*3/uL (ref 150.0–400.0)
RBC: 4.7 Mil/uL (ref 4.22–5.81)
RDW: 13.1 % (ref 11.5–15.5)
WBC: 4 10*3/uL (ref 4.0–10.5)

## 2021-05-05 LAB — LIPID PANEL
Cholesterol: 180 mg/dL (ref 0–200)
HDL: 41.9 mg/dL (ref >39.00)
LDL Cholesterol: 120 mg/dL — ABNORMAL HIGH (ref 0–99)
NonHDL: 137.62
Total CHOL/HDL Ratio: 4
Triglycerides: 88 mg/dL (ref 0.0–149.0)
VLDL: 17.6 mg/dL (ref 0.0–40.0)

## 2021-05-05 LAB — COMPREHENSIVE METABOLIC PANEL
ALT: 29 U/L (ref 0–53)
AST: 23 U/L (ref 0–37)
Albumin: 4.6 g/dL (ref 3.5–5.2)
Alkaline Phosphatase: 61 U/L (ref 39–117)
BUN: 27 mg/dL — ABNORMAL HIGH (ref 6–23)
CO2: 30 mEq/L (ref 19–32)
Calcium: 9.4 mg/dL (ref 8.4–10.5)
Chloride: 105 mEq/L (ref 96–112)
Creatinine, Ser: 1.44 mg/dL (ref 0.40–1.50)
GFR: 60.33 mL/min (ref >60.00)
Glucose, Bld: 94 mg/dL (ref 70–99)
Potassium: 4.9 mEq/L (ref 3.5–5.1)
Sodium: 141 mEq/L (ref 135–145)
Total Bilirubin: 1.1 mg/dL (ref 0.2–1.2)
Total Protein: 7.3 g/dL (ref 6.0–8.3)

## 2021-05-07 LAB — DRUG MONITORING PANEL 376104, URINE
Amphetamines: NEGATIVE ng/mL (ref <500)
Barbiturates: NEGATIVE ng/mL (ref <300)
Benzodiazepines: NEGATIVE ng/mL (ref <100)
Cocaine Metabolite: NEGATIVE ng/mL (ref <150)
Desmethyltramadol: NEGATIVE ng/mL (ref <100)
Opiates: NEGATIVE ng/mL (ref <100)
Oxycodone: NEGATIVE ng/mL (ref <100)
Tramadol: NEGATIVE ng/mL (ref <100)

## 2021-05-07 LAB — HEPATITIS C ANTIBODY
Hepatitis C Ab: NONREACTIVE
SIGNAL TO CUT-OFF: <0.02 (ref <1.00)

## 2021-05-07 LAB — DM TEMPLATE

## 2021-06-12 ENCOUNTER — Telehealth: Payer: Self-pay

## 2021-06-12 NOTE — Telephone Encounter (Signed)
PA initiated via Covermymeds; KEY: [id number]. PA approved. Effective  05/13/2021 to 06/12/2022. ?

## 2021-08-10 ENCOUNTER — Encounter: Payer: Self-pay | Admitting: Family Medicine

## 2021-08-10 ENCOUNTER — Other Ambulatory Visit: Payer: Self-pay | Admitting: Family Medicine

## 2021-08-10 DIAGNOSIS — F900 Attention-deficit hyperactivity disorder, predominantly inattentive type: Secondary | ICD-10-CM

## 2021-08-10 MED ORDER — DEXTROAMPHETAMINE SULFATE ER 15 MG PO CP24: 15.0000 mg | ORAL_CAPSULE | Freq: Every day | ORAL | 0 refills | Status: DC

## 2021-08-10 NOTE — Telephone Encounter (Signed)
Requesting: Dexedrine Spansule 15mg   ?Contract: None ?UDS: 05/05/21 ?Last Visit: 05/02/21 ?Next Visit: 10/30/21 ?Last Refill: 05/02/21 #30 and 0RF (x3) ? ?Please Advise ? ?

## 2021-08-15 ENCOUNTER — Encounter: Payer: Self-pay | Admitting: Family Medicine

## 2021-08-15 ENCOUNTER — Other Ambulatory Visit: Payer: Self-pay | Admitting: Family Medicine

## 2021-10-26 ENCOUNTER — Ambulatory Visit: Payer: Federal, State, Local not specified - PPO | Admitting: Family Medicine

## 2021-10-30 ENCOUNTER — Ambulatory Visit: Payer: Federal, State, Local not specified - PPO | Admitting: Family Medicine

## 2021-11-06 ENCOUNTER — Telehealth: Payer: Federal, State, Local not specified - PPO | Admitting: Family Medicine

## 2021-11-06 ENCOUNTER — Encounter: Payer: Self-pay | Admitting: Family Medicine

## 2021-11-06 DIAGNOSIS — F900 Attention-deficit hyperactivity disorder, predominantly inattentive type: Secondary | ICD-10-CM | POA: Diagnosis not present

## 2021-11-06 MED ORDER — DEXTROAMPHETAMINE SULFATE ER 15 MG PO CP24: 15.0000 mg | ORAL_CAPSULE | Freq: Every day | ORAL | 0 refills | Status: DC

## 2021-11-06 NOTE — Progress Notes (Signed)
Chief Complaint  Patient presents with   Follow-up    Douglas Winters is 41 y.o. male here for ADHD follow up. Due to COVID-19 pandemic, we are interacting via web portal for an electronic face-to-face visit. I verified patient's ID using 2 identifiers. Patient agreed to proceed with visit via this method. Patient is at home, I am at office. Patient and I are present for visit.   Patient is currently on Dexedrine and compliance is excellent. Symptoms are controlled. Side effects include: none. Patient believes their dose should be unchanged. Denies tics, weight loss, difficulties with sleep, self-medication, alcohol/drug abuse, chest pain, or palpitations.   Past Medical History:  Diagnosis Date   ADHD (attention deficit hyperactivity disorder), inattentive type    Objective No conversational dyspnea Age appropriate judgment and insight Nml affect and mood  ADHD (attention deficit hyperactivity disorder), inattentive type - Plan: dextroamphetamine (DEXEDRINE SPANSULE) 15 MG 24 hr capsule, dextroamphetamine (DEXEDRINE SPANSULE) 15 MG 24 hr capsule, dextroamphetamine (DEXEDRINE SPANSULE) 15 MG 24 hr capsule  Chronic, stable. Cont Dexedrine 15 mg/d. F/u in 6 mo for CPE. Pt voiced understanding and agreement to the plan.  Jilda Roche [place], DO 11/06/21 12:44 PM

## 2022-02-27 ENCOUNTER — Other Ambulatory Visit: Payer: Self-pay | Admitting: Family Medicine

## 2022-02-27 ENCOUNTER — Encounter: Payer: Self-pay | Admitting: Family Medicine

## 2022-02-27 DIAGNOSIS — F900 Attention-deficit hyperactivity disorder, predominantly inattentive type: Secondary | ICD-10-CM

## 2022-02-27 MED ORDER — DEXTROAMPHETAMINE SULFATE ER 15 MG PO CP24: 15.0000 mg | ORAL_CAPSULE | Freq: Every day | ORAL | 0 refills | Status: DC

## 2022-02-27 MED ORDER — DEXTROAMPHETAMINE SULFATE ER 15 MG PO CP24
15.0000 mg | ORAL_CAPSULE | Freq: Every day | ORAL | 0 refills | Status: DC
Start: 2022-03-29 — End: 2022-11-21

## 2022-03-12 DIAGNOSIS — J039 Acute tonsillitis, unspecified: Secondary | ICD-10-CM | POA: Diagnosis not present

## 2022-03-12 DIAGNOSIS — J019 Acute sinusitis, unspecified: Secondary | ICD-10-CM | POA: Diagnosis not present

## 2022-03-12 DIAGNOSIS — H6693 Otitis media, unspecified, bilateral: Secondary | ICD-10-CM | POA: Diagnosis not present

## 2022-10-30 DIAGNOSIS — Z1329 Encounter for screening for other suspected endocrine disorder: Secondary | ICD-10-CM | POA: Diagnosis not present

## 2022-10-30 DIAGNOSIS — Z125 Encounter for screening for malignant neoplasm of prostate: Secondary | ICD-10-CM | POA: Diagnosis not present

## 2022-10-30 DIAGNOSIS — E291 Testicular hypofunction: Secondary | ICD-10-CM | POA: Diagnosis not present

## 2022-11-07 ENCOUNTER — Encounter (INDEPENDENT_AMBULATORY_CARE_PROVIDER_SITE_OTHER): Payer: Self-pay

## 2022-11-21 ENCOUNTER — Ambulatory Visit (INDEPENDENT_AMBULATORY_CARE_PROVIDER_SITE_OTHER): Payer: Federal, State, Local not specified - PPO | Admitting: Family Medicine

## 2022-11-21 ENCOUNTER — Encounter: Payer: Self-pay | Admitting: Family Medicine

## 2022-11-21 VITALS — BP 108/68 | HR 91 | Temp 98.8°F | Ht 68.5 in | Wt 251.5 lb

## 2022-11-21 DIAGNOSIS — Z0000 Encounter for general adult medical examination without abnormal findings: Secondary | ICD-10-CM | POA: Diagnosis not present

## 2022-11-21 NOTE — Progress Notes (Signed)
Chief Complaint  Patient presents with   Annual Exam    Has not taken medication since last fall    Well Male Douglas Winters is here for a complete physical.   His last physical was >1 year ago.  Current diet: in general, diet could be better Current exercise: active in yard Weight trend: gaining Fatigue out of ordinary? No. Seat belt? Yes.   Advanced directive? No  Health maintenance Tetanus- Yes HIV- Yes Hep C- Yes  Past Medical History:  Diagnosis Date   ADHD (attention deficit hyperactivity disorder), inattentive type      Past Surgical History:  Procedure Laterality Date   EYE MUSCLE SURGERY     NASAL SEPTUM SURGERY     WISDOM TOOTH EXTRACTION      Medications  Takes no meds routinely.    Allergies No Known Allergies  Family History Family History  Problem Relation Age of Onset   Healthy Mother    Asthma Father        Childhood   Diabetes Maternal Grandmother    Clotting disorder Paternal Grandfather        Deceased   Alcoholism Paternal Grandfather    Healthy Brother    Amblyopia Child        x2    Review of Systems: Constitutional: no fevers or chills Eye:  no recent significant change in vision Ear/Nose/Mouth/Throat:  Ears:  no hearing loss Nose/Mouth/Throat:  no complaints of nasal congestion, no sore throat Cardiovascular:  no chest pain Respiratory:  no shortness of breath Gastrointestinal:  no abdominal pain, no change in bowel habits GU:  Male: negative for dysuria, frequency, and incontinence Musculoskeletal/Extremities:  no pain of the joints Integumentary (Skin/Breast):  no abnormal skin lesions reported Neurologic:  no headaches Endocrine: No unexpected weight changes Hematologic/Lymphatic:  no night sweats  Exam BP 108/68 (BP Location: Left Arm, Patient Position: Sitting, Cuff Size: Large)   Pulse 91   Temp 98.8 F (37.1 C) (Oral)   Ht 5' 8.5" (1.74 m)   Wt 251 lb 8 oz (114.1 kg)   SpO2 97%   BMI 37.68 kg/m  General:   well developed, well nourished, in no apparent distress Skin:  no significant moles, warts, or growths Head:  no masses, lesions, or tenderness Eyes:  pupils equal and round, sclera anicteric without injection Ears:  canals without lesions, TMs shiny without retraction, no obvious effusion, no erythema Nose:  nares patent, mucosa normal Throat/Pharynx:  lips and gingiva without lesion; tongue and uvula midline; non-inflamed pharynx; no exudates or postnasal drainage Neck: neck supple without adenopathy, thyromegaly, or masses Lungs:  clear to auscultation, breath sounds equal bilaterally, no respiratory distress Cardio:  regular rate and rhythm, no bruits, no LE edema Abdomen:  abdomen soft, nontender; bowel sounds normal; no masses or organomegaly Rectal: Deferred Musculoskeletal:  symmetrical muscle groups noted without atrophy or deformity Extremities:  no clubbing, cyanosis, or edema, no deformities, no skin discoloration Neuro:  gait normal; deep tendon reflexes normal and symmetric Psych: well oriented with normal range of affect and appropriate judgment/insight  Assessment and Plan  Well adult exam - Plan: CBC, Comprehensive metabolic panel, Lipid panel   Well 42 y.o. male. Counseled on diet and exercise. Needs to start exercising again.  Counseled on risks and benefits of prostate cancer screening with PSA. The patient agrees to forego screening.  Advanced directive form provided today.  Other orders as above. Follow up in 1 yr pending the above workup. The patient  voiced understanding and agreement to the plan.  Jilda Roche [place], DO 11/21/22 9:43 AM

## 2022-11-21 NOTE — Patient Instructions (Signed)
Give [place] 2-3 business days to get the results of your labs back.   Keep the diet clean and stay active.  I recommend getting the flu shot in mid October. This suggestion would change if the CDC comes out with a different recommendation.   Please get me a copy of your advanced directive form at your convenience.   Let us know if you need anything.

## 2022-11-22 DIAGNOSIS — Z6837 Body mass index (BMI) 37.0-37.9, adult: Secondary | ICD-10-CM | POA: Diagnosis not present

## 2022-11-22 DIAGNOSIS — F909 Attention-deficit hyperactivity disorder, unspecified type: Secondary | ICD-10-CM | POA: Diagnosis not present

## 2022-11-22 DIAGNOSIS — E291 Testicular hypofunction: Secondary | ICD-10-CM | POA: Diagnosis not present

## 2022-11-29 ENCOUNTER — Other Ambulatory Visit (INDEPENDENT_AMBULATORY_CARE_PROVIDER_SITE_OTHER): Payer: Federal, State, Local not specified - PPO

## 2022-11-29 DIAGNOSIS — Z0000 Encounter for general adult medical examination without abnormal findings: Secondary | ICD-10-CM

## 2022-11-29 LAB — COMPREHENSIVE METABOLIC PANEL
ALT: 45 U/L (ref 0–53)
AST: 29 U/L (ref 0–37)
Albumin: 4.6 g/dL (ref 3.5–5.2)
Alkaline Phosphatase: 62 U/L (ref 39–117)
BUN: 22 mg/dL (ref 6–23)
CO2: 29 mEq/L (ref 19–32)
Calcium: 9.9 mg/dL (ref 8.4–10.5)
Chloride: 102 mEq/L (ref 96–112)
Creatinine, Ser: 1.24 mg/dL (ref 0.40–1.50)
GFR: 71.4 mL/min (ref >60.00)
Glucose, Bld: 99 mg/dL (ref 70–99)
Potassium: 4.3 mEq/L (ref 3.5–5.1)
Sodium: 139 mEq/L (ref 135–145)
Total Bilirubin: 1.9 mg/dL — ABNORMAL HIGH (ref 0.2–1.2)
Total Protein: 7.3 g/dL (ref 6.0–8.3)

## 2022-11-29 LAB — LIPID PANEL
Cholesterol: 233 mg/dL — ABNORMAL HIGH (ref 0–200)
HDL: 33.3 mg/dL — ABNORMAL LOW (ref >39.00)
LDL Cholesterol: 163 mg/dL — ABNORMAL HIGH (ref 0–99)
NonHDL: 199.95
Total CHOL/HDL Ratio: 7
Triglycerides: 186 mg/dL — ABNORMAL HIGH (ref 0.0–149.0)
VLDL: 37.2 mg/dL (ref 0.0–40.0)

## 2022-11-29 LAB — CBC
HCT: 43.3 % (ref 39.0–52.0)
Hemoglobin: 14.6 g/dL (ref 13.0–17.0)
MCHC: 33.8 g/dL (ref 30.0–36.0)
MCV: 90.1 fl (ref 78.0–100.0)
Platelets: 264 10*3/uL (ref 150.0–400.0)
RBC: 4.8 Mil/uL (ref 4.22–5.81)
RDW: 13.4 % (ref 11.5–15.5)
WBC: 4.8 10*3/uL (ref 4.0–10.5)

## 2022-12-18 DIAGNOSIS — Z79890 Hormone replacement therapy (postmenopausal): Secondary | ICD-10-CM | POA: Diagnosis not present

## 2022-12-18 DIAGNOSIS — E291 Testicular hypofunction: Secondary | ICD-10-CM | POA: Diagnosis not present

## 2022-12-20 DIAGNOSIS — E291 Testicular hypofunction: Secondary | ICD-10-CM | POA: Diagnosis not present

## 2022-12-20 DIAGNOSIS — Z6838 Body mass index (BMI) 38.0-38.9, adult: Secondary | ICD-10-CM | POA: Diagnosis not present

## 2022-12-20 DIAGNOSIS — R6882 Decreased libido: Secondary | ICD-10-CM | POA: Diagnosis not present

## 2022-12-20 DIAGNOSIS — G479 Sleep disorder, unspecified: Secondary | ICD-10-CM | POA: Diagnosis not present

## 2022-12-26 DIAGNOSIS — Z9889 Other specified postprocedural states: Secondary | ICD-10-CM | POA: Diagnosis not present

## 2022-12-26 DIAGNOSIS — H50312 Intermittent monocular esotropia, left eye: Secondary | ICD-10-CM | POA: Diagnosis not present

## 2022-12-26 DIAGNOSIS — H5334 Suppression of binocular vision: Secondary | ICD-10-CM | POA: Diagnosis not present

## 2023-03-18 DIAGNOSIS — Z79890 Hormone replacement therapy (postmenopausal): Secondary | ICD-10-CM | POA: Diagnosis not present

## 2023-03-18 DIAGNOSIS — E291 Testicular hypofunction: Secondary | ICD-10-CM | POA: Diagnosis not present

## 2023-03-20 DIAGNOSIS — E291 Testicular hypofunction: Secondary | ICD-10-CM | POA: Diagnosis not present

## 2023-03-20 DIAGNOSIS — G479 Sleep disorder, unspecified: Secondary | ICD-10-CM | POA: Diagnosis not present

## 2023-03-20 DIAGNOSIS — Z6836 Body mass index (BMI) 36.0-36.9, adult: Secondary | ICD-10-CM | POA: Diagnosis not present

## 2023-03-20 DIAGNOSIS — R6882 Decreased libido: Secondary | ICD-10-CM | POA: Diagnosis not present

## 2023-05-23 ENCOUNTER — Ambulatory Visit: Payer: Self-pay | Admitting: Family Medicine

## 2023-05-23 NOTE — Telephone Encounter (Signed)
Chief Complaint: Potential skin/injection site infection Symptoms: warmth, redness, swelling Frequency: since Tuesday Pertinent Negatives: Patient denies fever Disposition: [] ED /[] Urgent Care (no appt availability in office) / [x] Appointment(In office/virtual)/ []  [facility]/ [] Home Care/ [] Refused Recommended Disposition /[] [facility]/ []  Follow-up with PCP Additional Notes: Patient called in stating he gave himself an injection of in right, upper thigh on Monday and noticed on Tuesday that there was redness, swelling and warmth to the area that has not resolved. Patient states site has soreness to it. Patient concerned about potential infection. Video appointment scheduled for tomorrow for further evaluation. Advised patient to keep site clean and covered and to apply antibiotic ointment.    Copied from CRM [id number]. Topic: Clinical - Red Word Triage >> May 23, 2023  4:05 PM Isabell A wrote: Red Word that prompted transfer to Nurse Triage: Patient states he may have an infection on his upper right leg - red area that's swollen that feels hot/warm. Reason for Disposition  [1] Looks infected (spreading redness, red streak) AND [2] no fever  Answer Assessment - Initial Assessment Questions 1. LOCATION: "Where is the wound located?"      Right upper, outer thigh 2. WOUND APPEARANCE: "What does the wound look like?"      Red circle around the injection site 3. SIZE: If redness is present, ask: "What is the size of the red area?" (Inches, centimeters, or compare to size of a coin)      50 cent piece... golfball size and swollen 4. SPREAD: "What's changed in the last day?"  "Do you see any red streaks coming from the wound?"     No 5. ONSET: "When did it start to look infected?"      Tuesday night 6. MECHANISM: "How did the wound start, what was the cause?"     Injection of shot 7. PAIN: Do you have any pain?"  If Yes, ask: "How bad is the pain?"  (e.g., Scale 1-10;  mild, moderate, or severe)    - MILD (1-3): Doesn't interfere with normal activities.     - MODERATE (4-7): Interferes with normal activities or awakens from sleep.    - SEVERE (8-10): Excruciating pain, unable to do any normal activities.       Sore to the touch, mild 8. FEVER: "Do you have a fever?" If Yes, ask: "What is your temperature, how was it measured, and when did it start?"     No 9. OTHER SYMPTOMS: "Do you have any other symptoms?" (e.g., shaking chills, weakness, rash elsewhere on body)     No  Protocols used: Wound Infection Suspected-A-AH

## 2023-05-24 ENCOUNTER — Telehealth (INDEPENDENT_AMBULATORY_CARE_PROVIDER_SITE_OTHER): Payer: Federal, State, Local not specified - PPO | Admitting: Family Medicine

## 2023-05-24 ENCOUNTER — Encounter: Payer: Self-pay | Admitting: Family Medicine

## 2023-05-24 DIAGNOSIS — L03115 Cellulitis of right lower limb: Secondary | ICD-10-CM | POA: Diagnosis not present

## 2023-05-24 MED ORDER — CEPHALEXIN 500 MG PO CAPS: 500.0000 mg | ORAL_CAPSULE | Freq: Two times a day (BID) | ORAL | 0 refills | Status: DC

## 2023-05-24 NOTE — Progress Notes (Signed)
MyChart Video Visit    Virtual Visit via Video Note   This patient is at least at moderate risk for complications without adequate follow up. This format is felt to be most appropriate for this patient at this time. Physical exam was limited by quality of the video and audio technology used for the visit. Douglas Winters was able to get the patient set up on a video visit.  Patient location: home  Patient and provider in visit Provider location: Office  I discussed the limitations of evaluation and management by telemedicine and the availability of in person appointments. The patient expressed understanding and agreed to proceed.  Visit Date: 05/24/2023  Today's healthcare provider: Donato Schultz, DO     Subjective:    Patient ID: Douglas Winters, male    DOB: 02-10-1981, 43 y.o.   MRN: [id number]  Chief Complaint  Patient presents with   Wound Check    Right upper thigh, Testerone injection     HPI Patient is in today for redness R thigh from injection he gets from blue [facility].   Discussed the use of AI scribe software for clinical note transcription with the patient, who gave verbal consent to proceed.  History of Present Illness   Douglas Winters "Susy Frizzle" is a 43 year old male who presents with redness, swelling, and soreness at the site of testosterone replacement therapy injection.  He has been performing weekly testosterone replacement therapy (TRT) injections since August of the previous year without significant issues. Following his most recent injection on Monday, he noticed soreness and redness at the injection site by Tuesday evening. The symptoms progressed to significant redness, swelling, and soreness by Friday. The area is warm to touch but seems slightly improved today.  He administers the injections himself at home, targeting the mid part of his thigh. He uses an alcohol pad to clean the area before injection and washes his hands prior to the procedure. No known  allergies and has not experienced similar reactions in the past.  He follows up with a specialist at [facility] every three months for his TRT management.       Past Medical History:  Diagnosis Date   ADHD (attention deficit hyperactivity disorder), inattentive type     Past Surgical History:  Procedure Laterality Date   EYE MUSCLE SURGERY     NASAL SEPTUM SURGERY     WISDOM TOOTH EXTRACTION      Family History  Problem Relation Age of Onset   Healthy Mother    Asthma Father        Childhood   Diabetes Maternal Grandmother    Clotting disorder Paternal Grandfather        Deceased   Alcoholism Paternal Grandfather    Healthy Brother    Amblyopia Child        x2    Social History   Socioeconomic History   Marital status: Married    Spouse name: Not on file   Number of children: Not on file   Years of education: Not on file   Highest education level: Not on file  Occupational History   Not on file  Tobacco Use   Smoking status: Never   Smokeless tobacco: Never  Substance and Sexual Activity   Alcohol use: Yes    Alcohol/week: 0.0 - 1.0 standard drinks of alcohol   Drug use: No   Sexual activity: Yes    Birth control/protection: Condom  Other Topics Concern  Not on file  Social History Narrative   Not on file   Social Drivers of Health   Financial Resource Strain: Not on file  Food Insecurity: Not on file  Transportation Needs: Not on file  Physical Activity: Not on file  Stress: Not on file  Social Connections: Not on file  Intimate Partner Violence: Not on file    No outpatient medications prior to visit.   No facility-administered medications prior to visit.    No Known Allergies  Review of Systems  Constitutional:  Negative for fever and malaise/fatigue.  HENT:  Negative for congestion.   Eyes:  Negative for blurred vision.  Respiratory:  Negative for cough and shortness of breath.   Cardiovascular:  Negative for chest pain, palpitations  and leg swelling.  Gastrointestinal:  Negative for vomiting.  Musculoskeletal:  Negative for back pain.  Skin:  Negative for rash.  Neurological:  Negative for loss of consciousness and headaches.       Objective:    Physical Exam Vitals and nursing note reviewed.  Skin:    Findings: Erythema present.          Comments: Redness R thigh -- hot to touch,tender      There were no vitals taken for this visit. Wt Readings from Last 3 Encounters:  11/21/22 251 lb 8 oz (114.1 kg)  05/02/21 242 lb 8 oz (110 kg)  01/11/20 221 lb 7 oz (100.4 kg)       Assessment & Plan:  Cellulitis of right lower extremity -     Cephalexin; Take 1 capsule (500 mg total) by mouth 2 (two) times daily.  Dispense: 20 capsule; Refill: 0   Assessment and Plan    Injection Site Reaction Reports redness, swelling, and soreness at the injection site on the right thigh following weekly testosterone replacement therapy injection. Symptoms began Tuesday evening and have persisted, with significant redness and swelling noted by Friday. The area is warm to the touch, suggesting a possible localized infection or inflammatory reaction. Performs self-injections at home with proper aseptic techniques. No known allergies. Informed about the potential for localized infections despite proper technique and the importance of monitoring the site for changes. Prescribe Keflex 500 mg PO QID for 10 days. Instruct to monitor the injection site and mark the borders of the redness with a pen. Advise to call if the redness or swelling increases. Document the use of [facility] for prescription fulfillment.        I discussed the assessment and treatment plan with the patient. The patient was provided an opportunity to ask questions and all were answered. The patient agreed with the plan and demonstrated an understanding of the instructions.   The patient was advised to call back or seek an in-person evaluation if the symptoms  worsen or if the condition fails to improve as anticipated.  Donato Schultz, DO [facility] at [facility] [phone] (phone) [phone] (fax)  [facility]

## 2024-01-12 ENCOUNTER — Other Ambulatory Visit: Payer: Self-pay

## 2024-01-12 ENCOUNTER — Encounter (HOSPITAL_COMMUNITY): Payer: Self-pay | Admitting: *Deleted

## 2024-01-12 ENCOUNTER — Emergency Department (HOSPITAL_COMMUNITY)
Admission: EM | Admit: 2024-01-12 | Discharge: 2024-01-12 | Disposition: A | Discharge status: Home / Self Care | Attending: Emergency Medicine | Admitting: Emergency Medicine

## 2024-01-12 DIAGNOSIS — R7989 Other specified abnormal findings of blood chemistry: Secondary | ICD-10-CM | POA: Insufficient documentation

## 2024-01-12 DIAGNOSIS — N23 Unspecified renal colic: Secondary | ICD-10-CM | POA: Diagnosis not present

## 2024-01-12 DIAGNOSIS — R1031 Right lower quadrant pain: Secondary | ICD-10-CM | POA: Diagnosis present

## 2024-01-12 LAB — URINALYSIS, ROUTINE W REFLEX MICROSCOPIC
Bacteria, UA: NONE SEEN
Bilirubin Urine: NEGATIVE
Glucose, UA: NEGATIVE mg/dL
Ketones, ur: 20 mg/dL — AB
Leukocytes,Ua: NEGATIVE
Nitrite: NEGATIVE
Protein, ur: NEGATIVE mg/dL
RBC / HPF: >50 RBC/hpf (ref 0–5)
Specific Gravity, Urine: 1.018 (ref 1.005–1.030)
pH: 6 (ref 5.0–8.0)

## 2024-01-12 LAB — COMPREHENSIVE METABOLIC PANEL WITH GFR
ALT: 38 U/L (ref 0–44)
AST: 42 U/L — ABNORMAL HIGH (ref 15–41)
Albumin: 4.4 g/dL (ref 3.5–5.0)
Alkaline Phosphatase: 52 U/L (ref 38–126)
Anion gap: 13 (ref 5–15)
BUN: 21 mg/dL — ABNORMAL HIGH (ref 6–20)
CO2: 24 mmol/L (ref 22–32)
Calcium: 9.1 mg/dL (ref 8.9–10.3)
Chloride: 102 mmol/L (ref 98–111)
Creatinine, Ser: 1.7 mg/dL — ABNORMAL HIGH (ref 0.61–1.24)
GFR, Estimated: 50 mL/min — ABNORMAL LOW (ref >60)
Glucose, Bld: 158 mg/dL — ABNORMAL HIGH (ref 70–99)
Potassium: 4 mmol/L (ref 3.5–5.1)
Sodium: 139 mmol/L (ref 135–145)
Total Bilirubin: 1.5 mg/dL — ABNORMAL HIGH (ref 0.0–1.2)
Total Protein: 7.4 g/dL (ref 6.5–8.1)

## 2024-01-12 LAB — CBC
HCT: 45.7 % (ref 39.0–52.0)
Hemoglobin: 15.5 g/dL (ref 13.0–17.0)
MCH: 31.3 pg (ref 26.0–34.0)
MCHC: 33.9 g/dL (ref 30.0–36.0)
MCV: 92.3 fL (ref 80.0–100.0)
Platelets: 217 K/uL (ref 150–400)
RBC: 4.95 MIL/uL (ref 4.22–5.81)
RDW: 12.8 % (ref 11.5–15.5)
WBC: 11.1 K/uL — ABNORMAL HIGH (ref 4.0–10.5)
nRBC: 0 % (ref 0.0–0.2)

## 2024-01-12 LAB — LIPASE, BLOOD: Lipase: 62 U/L — ABNORMAL HIGH (ref 11–51)

## 2024-01-12 MED ORDER — OXYCODONE-ACETAMINOPHEN 5-325 MG PO TABS: 1.0000 | ORAL_TABLET | Freq: Four times a day (QID) | ORAL | 0 refills | Status: DC | PRN

## 2024-01-12 MED ORDER — KETOROLAC TROMETHAMINE 15 MG/ML IJ SOLN
15.0000 mg | Freq: Once | INTRAMUSCULAR | Status: AC
  Administered 2024-01-12: 15 mg via INTRAMUSCULAR
  Filled 2024-01-12: qty 1

## 2024-01-12 NOTE — ED Notes (Signed)
 Pain nearly resolved at this time, was very severe and sharp pta but mild at this time 2/10

## 2024-01-12 NOTE — ED Triage Notes (Signed)
 Pt is here for evaluation of RLQ pain which woke him up around midnight.  Pt has had severe pain causing him to having nausea and vomiting (as well as dry heaves).  Pain is less at this time, no urinary symptoms with this.  LBM 2130 last pm and it was normal.

## 2024-01-12 NOTE — Discharge Instructions (Addendum)
 Your kidney function was elevated which is likely related to an obstructing kidney stone on the right side.  We recommend that this be rechecked by a primary care doctor within the week for trending.    Use 400mg  ibuprofen every 6 hours for pain control. You have been prescribed Percocet to take for management of severe pain.  Do not drive or drink alcohol after taking this medication as it may make you drowsy and impair your judgment.  If you continue to have persistent pain from your kidney stone, we advise follow-up with urology.  Call [facility] to schedule an appointment.  You may also return to the ED for new or concerning symptoms.

## 2024-01-12 NOTE — ED Provider Notes (Signed)
 [facility] EMERGENCY DEPARTMENT AT [facility] Provider Note   CSN: [id number] Arrival date & time: 01/12/24  9665     Patient presents with: Abdominal Pain (RLQ)   Douglas Winters is a 43 y.o. male.   43 year old male presents to the emergency department for evaluation of right lower quadrant abdominal pain.  Pain began just after getting to bed to go to sleep, around midnight.  States that pain gradually worsened and became severe.  He experienced nausea and vomiting x 3 hours secondary to persistent and worsening pain.  His pain has now spontaneously improved.  He did not take any medications prior to arrival.  Currently rates his pain at 2/10.  No prior abdominal surgeries.  Denies associated fevers, bowel changes, dysuria.  The history is provided by the patient and the spouse. No language interpreter was used.  Abdominal Pain      Prior to Admission medications   Medication Sig Start Date End Date Taking? Authorizing Provider  oxyCODONE-acetaminophen (PERCOCET/ROXICET) 5-325 MG tablet Take 1 tablet by mouth every 6 (six) hours as needed for severe pain (pain score 7-10). 01/12/24  Yes Keith Sor, PA-C  cephALEXin  (KEFLEX ) 500 MG capsule Take 1 capsule (500 mg total) by mouth 2 (two) times daily. 05/24/23   Lowne Chase, Yvonne R, DO    Allergies: Patient has no known allergies.    Review of Systems  Gastrointestinal:  Positive for abdominal pain.  Ten systems reviewed and are negative for acute change, except as noted in the HPI.    Updated Vital Signs BP (!) 144/83 (BP Location: Right Arm)   Pulse 93   Temp 97.6 F (36.4 C)   Resp 18   SpO2 100%   Physical Exam Vitals and nursing note reviewed.  Constitutional:      General: He is not in acute distress.    Appearance: He is well-developed. He is not diaphoretic.     Comments: Nontoxic appearing and in NAD  HENT:     Head: Normocephalic and atraumatic.  Eyes:     General: No scleral icterus.     Conjunctiva/sclera: Conjunctivae normal.  Pulmonary:     Effort: Pulmonary effort is normal. No respiratory distress.     Comments: Respirations even and unlabored Abdominal:     Comments: Abdomen soft, nondistended. No focal TTP.  Musculoskeletal:        General: Normal range of motion.     Cervical back: Normal range of motion.  Skin:    General: Skin is warm and dry.     Coloration: Skin is not pale.     Findings: No erythema or rash.  Neurological:     Mental Status: He is alert and oriented to person, place, and time.  Psychiatric:        Behavior: Behavior normal.     (all labs ordered are listed, but only abnormal results are displayed) Labs Reviewed  COMPREHENSIVE METABOLIC PANEL WITH GFR - Abnormal; Notable for the following components:      Result Value   Glucose, Bld 158 (*)    BUN 21 (*)    Creatinine, Ser 1.70 (*)    AST 42 (*)    Total Bilirubin 1.5 (*)    GFR, Estimated 50 (*)    All other components within normal limits  CBC - Abnormal; Notable for the following components:   WBC 11.1 (*)    All other components within normal limits  URINALYSIS, ROUTINE W REFLEX MICROSCOPIC -  Abnormal; Notable for the following components:   Hgb urine dipstick LARGE (*)    Ketones, ur 20 (*)    All other components within normal limits  LIPASE, BLOOD    EKG: None  Radiology: No results found.   Procedures   Medications Ordered in the ED  ketorolac  (TORADOL ) 15 MG/ML injection 15 mg (15 mg Intramuscular Given 01/12/24 0502)    Clinical Course as of 01/12/24 0604  Sun Jan 12, 2024  0503 Presenting for sudden onset right lower quadrant abdominal pain which became severe prior to arrival eliciting nausea/vomiting.  Pain has now spontaneously improved.  His clinical presentation is consistent with obstructing ureteral stone.  Unclear if this has completely passed.  Hematuria noted on urinalysis consistent with this diagnosis.  He also has elevation of his creatinine  which is likely associated with transient obstruction.  Have offered the patient CT for definitive diagnosis while in the emergency department; however, do not feel that this would presently change his management.  I do recommend follow-up with urology and will provide him a referral for this.  Can have creatinine rechecked by PCP in the interim.  Patient also given 1 dose of Toradol  for management of any residual pain. [KH]    Clinical Course User Index [KH] Keith Sor, PA-C                                 Medical Decision Making Amount and/or Complexity of Data Reviewed Labs: ordered.  Risk Prescription drug management.   This patient presents to the ED for concern of R sided abdominal pain, this involves an extensive number of treatment options, and is a complaint that carries with it a high risk of complications and morbidity.  The differential diagnosis includes kidney stone vs appendicitis vs biliary colic vs constipation vs viral illness vs ruptured viscous    Co morbidities that complicate the patient evaluation  None known   Additional history obtained:  Additional history obtained from spouse, at bedside   Lab Tests:  I Ordered, and personally interpreted labs.  The pertinent results include:  WBC 11.1 (likely stress 2/2 pain, vomiting), Creatinine 1.7 (c/w suspected obstructive uropathy).    Cardiac Monitoring:  The patient was maintained on a cardiac monitor.  I personally viewed and interpreted the cardiac monitored which showed an underlying rhythm of: NSR   Medicines ordered and prescription drug management:  I ordered medication including Toradol  for pain  Reevaluation of the patient after these medicines showed that the patient improved I have reviewed the patients home medicines and have made adjustments as needed   Test Considered:  CT renal stone study - didn't feel this would acutely change management. Can be completed outpatient if deemed  necessary by Urology   Problem List / ED Course:  As above   Reevaluation:  After the interventions noted above, I reevaluated the patient and found that they have :stayed the same   Social Determinants of Health:  Good social support   Dispostion:  After consideration of the diagnostic results and the patients response to treatment, I feel that the patent would benefit from patient urology follow-up for suspected right sided ureterolithiasis.  Pain improved spontaneously; stable following IM Toradol .  Indicated need for patient to have creatinine rechecked by PCP within 1 week.  Return precautions discussed and provided. Patient discharged in stable condition with no unaddressed concerns.      Final  diagnoses:  Ureteral colic  Elevated serum creatinine    ED Discharge Orders          Ordered    oxyCODONE-acetaminophen (PERCOCET/ROXICET) 5-325 MG tablet  Every 6 hours PRN        01/12/24 0543               Keith Sor, PA-C 01/12/24 9392    Jerrol Agent, MD 01/12/24 2312

## 2024-01-13 ENCOUNTER — Ambulatory Visit: Admitting: Family Medicine

## 2024-01-13 ENCOUNTER — Encounter: Payer: Self-pay | Admitting: Family Medicine

## 2024-01-13 VITALS — BP 124/78 | HR 91 | Temp 97.8°F | Resp 16 | Ht 68.0 in | Wt 226.0 lb

## 2024-01-13 DIAGNOSIS — N23 Unspecified renal colic: Secondary | ICD-10-CM

## 2024-01-13 NOTE — Progress Notes (Signed)
 Chief Complaint  Patient presents with   Flank Pain    Flank Pain    Subjective: Patient is a 43 y.o. male here for ER follow-up.  2 days ago he started having flank pain radiating to his right groin.  He rated it as a 9/10.  Nothing seems to improve it.  Denies any trauma or urinary complaints.  He was told trace blood was found in the urine.  He was offered a CT scan but it would add 3 hours to his visit so he decided to follow-up outpatient.  He has never had a stone before.  No family history.  Past Medical History:  Diagnosis Date   ADHD (attention deficit hyperactivity disorder), inattentive type     Objective: BP 124/78 (BP Location: Left Arm, Patient Position: Sitting)   Pulse 91   Temp 97.8 F (36.6 C) (Temporal)   Resp 16   Ht 5' 8 (1.727 m)   Wt 226 lb (102.5 kg)   SpO2 95%   BMI 34.36 kg/m  General: Awake, appears stated age Heart: RRR Abd: BS+, S, NT, ND MSK: No TTP over the CVA region or inguinal region; negative Lloyd's Lungs: CTAB, no rales, wheezes or rhonchi. No accessory muscle use Psych: Age appropriate judgment and insight, normal affect and mood  Assessment and Plan: Renal pain  Symptoms have completely resolved.  I will see him shortly for his physical and follow-up on abnormal labs.  Tylenol as needed.  Stay hydrated.  Dietary guidelines to prevent recurrent stones provided. The patient voiced understanding and agreement to the plan.  Mabel Mt Napoleon, DO 01/13/24  3:34 PM

## 2024-01-13 NOTE — Patient Instructions (Addendum)
 Given your resolution of symptoms, we can hold off on doing anything for now.   Stay hydrated.   Let us  know if you need anything.

## 2024-02-11 ENCOUNTER — Encounter: Admitting: Family Medicine

## 2024-02-18 ENCOUNTER — Ambulatory Visit: Payer: Self-pay | Admitting: Family Medicine

## 2024-02-18 ENCOUNTER — Ambulatory Visit (INDEPENDENT_AMBULATORY_CARE_PROVIDER_SITE_OTHER): Admitting: Family Medicine

## 2024-02-18 VITALS — BP 120/76 | HR 95 | Temp 98.0°F | Resp 16 | Ht 68.0 in | Wt 223.0 lb

## 2024-02-18 DIAGNOSIS — Z0000 Encounter for general adult medical examination without abnormal findings: Secondary | ICD-10-CM | POA: Diagnosis not present

## 2024-02-18 LAB — CBC
HCT: 44.5 % (ref 39.0–52.0)
Hemoglobin: 15.5 g/dL (ref 13.0–17.0)
MCHC: 34.8 g/dL (ref 30.0–36.0)
MCV: 90.8 fl (ref 78.0–100.0)
Platelets: 236 K/uL (ref 150.0–400.0)
RBC: 4.9 Mil/uL (ref 4.22–5.81)
RDW: 13 % (ref 11.5–15.5)
WBC: 4.3 K/uL (ref 4.0–10.5)

## 2024-02-18 LAB — COMPREHENSIVE METABOLIC PANEL WITH GFR
ALT: 29 U/L (ref 0–53)
AST: 31 U/L (ref 0–37)
Albumin: 4.6 g/dL (ref 3.5–5.2)
Alkaline Phosphatase: 58 U/L (ref 39–117)
BUN: 19 mg/dL (ref 6–23)
CO2: 30 meq/L (ref 19–32)
Calcium: 9.3 mg/dL (ref 8.4–10.5)
Chloride: 102 meq/L (ref 96–112)
Creatinine, Ser: 1.38 mg/dL (ref 0.40–1.50)
GFR: 62.26 mL/min (ref >60.00)
Glucose, Bld: 89 mg/dL (ref 70–99)
Potassium: 4.6 meq/L (ref 3.5–5.1)
Sodium: 138 meq/L (ref 135–145)
Total Bilirubin: 1.6 mg/dL — ABNORMAL HIGH (ref 0.2–1.2)
Total Protein: 6.9 g/dL (ref 6.0–8.3)

## 2024-02-18 LAB — LIPID PANEL
Cholesterol: 186 mg/dL (ref 0–200)
HDL: 33 mg/dL — ABNORMAL LOW (ref >39.00)
LDL Cholesterol: 137 mg/dL — ABNORMAL HIGH (ref 0–99)
NonHDL: 153.37
Total CHOL/HDL Ratio: 6
Triglycerides: 81 mg/dL (ref 0.0–149.0)
VLDL: 16.2 mg/dL (ref 0.0–40.0)

## 2024-02-18 NOTE — Patient Instructions (Signed)
 Give [place] 2-3 business days to get the results of your labs back.   Keep the diet clean and stay active.  Please get me a copy of your advanced directive form at your convenience.   Let us know if you need anything.

## 2024-02-18 NOTE — Progress Notes (Signed)
 Chief Complaint  Patient presents with   Annual Exam    CPE    Well Male Douglas Winters is here for a complete physical.   His last physical was >1 year ago.  Current diet: in general, diet is fair.   Current exercise: lifting wts Weight trend: stable Fatigue out of ordinary? No. Seat belt? Yes.   Advanced directive? No  Health maintenance Tetanus- Yes HIV- Yes Hep C- Yes  Past Medical History:  Diagnosis Date   ADHD (attention deficit hyperactivity disorder), inattentive type      Past Surgical History:  Procedure Laterality Date   EYE MUSCLE SURGERY     NASAL SEPTUM SURGERY     WISDOM TOOTH EXTRACTION      Medications  Takes no meds routinely.     Allergies No Known Allergies  Family History Family History  Problem Relation Age of Onset   Healthy Mother    Asthma Father        Childhood   Diabetes Maternal Grandmother    Clotting disorder Paternal Grandfather        Deceased   Alcoholism Paternal Grandfather    Healthy Brother    Amblyopia Child        x2    Review of Systems: Constitutional: no fevers or chills Eye:  no recent significant change in vision Ear/Nose/Mouth/Throat:  Ears:  no hearing loss Nose/Mouth/Throat:  no complaints of nasal congestion, no sore throat Cardiovascular:  no chest pain Respiratory:  no shortness of breath Gastrointestinal:  no abdominal pain, no change in bowel habits GU:  Male: negative for dysuria, frequency, and incontinence Musculoskeletal/Extremities:  no pain of the joints Integumentary (Skin/Breast):  no abnormal skin lesions reported Neurologic:  no headaches Endocrine: No unexpected weight changes Hematologic/Lymphatic:  no night sweats  Exam BP 120/76 (BP Location: Left Arm, Patient Position: Sitting)   Pulse 95   Temp 98 F (36.7 C) (Oral)   Resp 16   Ht 5' 8 (1.727 m)   Wt 223 lb (101.2 kg)   SpO2 98%   BMI 33.91 kg/m  General:  well developed, well nourished, in no apparent  distress Skin:  no significant moles, warts, or growths Head:  no masses, lesions, or tenderness Eyes:  pupils equal and round, sclera anicteric without injection Ears:  canals without lesions, TMs shiny without retraction, no obvious effusion, no erythema Nose:  nares patent, mucosa normal Throat/Pharynx:  lips and gingiva without lesion; tongue and uvula midline; non-inflamed pharynx; no exudates or postnasal drainage Neck: neck supple without adenopathy, thyromegaly, or masses Lungs:  clear to auscultation, breath sounds equal bilaterally, no respiratory distress Cardio:  regular rate and rhythm, no bruits, no LE edema Abdomen:  abdomen soft, nontender; bowel sounds normal; no masses or organomegaly Rectal: Deferred Musculoskeletal:  symmetrical muscle groups noted without atrophy or deformity Extremities:  no clubbing, cyanosis, or edema, no deformities, no skin discoloration Neuro:  gait normal; deep tendon reflexes normal and symmetric Psych: well oriented with normal range of affect and appropriate judgment/insight  Assessment and Plan  Well adult exam - Plan: CBC, Comprehensive metabolic panel with GFR, Lipid panel, Hepatitis B surface antibody,quantitative   Well 43 y.o. male. Counseled on diet and exercise. Advanced directive form provided today.  Flu shot politely declined.  Screen Hep B.  Other orders as above. Follow up in 1 yr pending the above workup. The patient voiced understanding and agreement to the plan.  Mabel Mt Friona, DO 02/18/24 8:40 AM

## 2024-02-19 LAB — HEPATITIS B SURFACE ANTIBODY, QUANTITATIVE: Hep B S AB Quant (Post): <5 m[IU]/mL — ABNORMAL LOW (ref >=10)

## 2025-02-19 ENCOUNTER — Encounter: Admitting: Family Medicine
